# Patient Record
Sex: Male | Born: 1960 | Race: White | Hispanic: No | Marital: Married | Smoking: Current every day smoker
Health system: Southern US, Community
[De-identification: ages and names within clinical notes are randomized; demographics above are authoritative.]

## PROBLEM LIST (undated history)

## (undated) DIAGNOSIS — F419 Anxiety disorder, unspecified: Secondary | ICD-10-CM

## (undated) DIAGNOSIS — E079 Disorder of thyroid, unspecified: Secondary | ICD-10-CM

## (undated) HISTORY — DX: Anxiety disorder, unspecified: F41.9

## (undated) HISTORY — DX: Disorder of thyroid, unspecified: E07.9

---

## 2006-06-12 ENCOUNTER — Emergency Department: Payer: Self-pay | Admitting: Emergency Medicine

## 2006-06-12 ENCOUNTER — Other Ambulatory Visit: Payer: Self-pay

## 2007-05-25 ENCOUNTER — Emergency Department: Payer: Self-pay | Admitting: Emergency Medicine

## 2007-10-18 ENCOUNTER — Emergency Department: Payer: Self-pay | Admitting: Internal Medicine

## 2012-08-09 ENCOUNTER — Emergency Department: Payer: Self-pay | Admitting: Emergency Medicine

## 2012-08-09 LAB — CK TOTAL AND CKMB (NOT AT ARMC)
CK, Total: 61 U/L (ref 35–232)
CK-MB: <0.5 ng/mL — ABNORMAL LOW (ref 0.5–3.6)

## 2012-08-09 LAB — CBC WITH DIFFERENTIAL/PLATELET
Basophil #: 0.1 10*3/uL (ref 0.0–0.1)
Eosinophil %: 1.8 %
Lymphocyte #: 1.6 10*3/uL (ref 1.0–3.6)
Lymphocyte %: 15.5 %
MCHC: 33.9 g/dL (ref 32.0–36.0)
MCV: 95 fL (ref 80–100)
Monocyte %: 6.8 %
Neutrophil %: 75.3 %
Platelet: 281 10*3/uL (ref 150–440)
RDW: 13.5 % (ref 11.5–14.5)

## 2012-08-09 LAB — COMPREHENSIVE METABOLIC PANEL
Albumin: 4 g/dL (ref 3.4–5.0)
BUN: 10 mg/dL (ref 7–18)
Bilirubin,Total: 0.5 mg/dL (ref 0.2–1.0)
Calcium, Total: 8.4 mg/dL — ABNORMAL LOW (ref 8.5–10.1)
Co2: 25 mmol/L (ref 21–32)
Creatinine: 0.87 mg/dL (ref 0.60–1.30)
Glucose: 99 mg/dL (ref 65–99)
Osmolality: 280 (ref 275–301)
Potassium: 4 mmol/L (ref 3.5–5.1)
SGOT(AST): 20 U/L (ref 15–37)
Sodium: 141 mmol/L (ref 136–145)

## 2012-09-26 ENCOUNTER — Ambulatory Visit: Payer: Self-pay | Admitting: Family Medicine

## 2014-02-02 LAB — HM HEPATITIS C SCREENING LAB: HM Hepatitis Screen: NEGATIVE

## 2014-12-21 LAB — HM HIV SCREENING LAB: HM HIV Screening: NEGATIVE

## 2018-02-17 LAB — HM COLONOSCOPY

## 2018-03-10 ENCOUNTER — Other Ambulatory Visit
Admission: RE | Admit: 2018-03-10 | Discharge: 2018-03-10 | Disposition: A | Discharge status: Home / Self Care | Payer: Self-pay | Source: Ambulatory Visit | Attending: Gastroenterology | Admitting: Gastroenterology

## 2018-03-10 DIAGNOSIS — R1032 Left lower quadrant pain: Secondary | ICD-10-CM | POA: Insufficient documentation

## 2018-03-10 DIAGNOSIS — R1031 Right lower quadrant pain: Secondary | ICD-10-CM | POA: Insufficient documentation

## 2018-03-10 DIAGNOSIS — K529 Noninfective gastroenteritis and colitis, unspecified: Secondary | ICD-10-CM | POA: Insufficient documentation

## 2018-03-10 LAB — GASTROINTESTINAL PANEL BY PCR, STOOL (REPLACES STOOL CULTURE)

## 2018-03-10 LAB — C DIFFICILE QUICK SCREEN W PCR REFLEX
C Diff antigen: NEGATIVE
C Diff interpretation: NOT DETECTED
C Diff toxin: NEGATIVE

## 2019-07-15 ENCOUNTER — Encounter: Payer: Self-pay | Admitting: Family Medicine

## 2019-07-15 ENCOUNTER — Ambulatory Visit (INDEPENDENT_AMBULATORY_CARE_PROVIDER_SITE_OTHER): Payer: 59 | Admitting: Family Medicine

## 2019-07-15 ENCOUNTER — Other Ambulatory Visit: Payer: Self-pay

## 2019-07-15 VITALS — BP 148/85 | HR 69 | Temp 98.4°F | Resp 16 | Ht 70.0 in | Wt 194.0 lb

## 2019-07-15 DIAGNOSIS — F410 Panic disorder [episodic paroxysmal anxiety] without agoraphobia: Secondary | ICD-10-CM

## 2019-07-15 DIAGNOSIS — Z23 Encounter for immunization: Secondary | ICD-10-CM | POA: Diagnosis not present

## 2019-07-15 DIAGNOSIS — F411 Generalized anxiety disorder: Secondary | ICD-10-CM | POA: Insufficient documentation

## 2019-07-15 DIAGNOSIS — Z7689 Persons encountering health services in other specified circumstances: Secondary | ICD-10-CM

## 2019-07-15 DIAGNOSIS — G8929 Other chronic pain: Secondary | ICD-10-CM

## 2019-07-15 DIAGNOSIS — M25512 Pain in left shoulder: Secondary | ICD-10-CM

## 2019-07-15 DIAGNOSIS — E039 Hypothyroidism, unspecified: Secondary | ICD-10-CM

## 2019-07-15 DIAGNOSIS — M25511 Pain in right shoulder: Secondary | ICD-10-CM

## 2019-07-15 MED ORDER — LEVOTHYROXINE SODIUM 200 MCG PO TABS: 200.0000 ug | ORAL_TABLET | Freq: Every day | ORAL | 1 refills | Status: DC

## 2019-07-15 MED ORDER — CYCLOBENZAPRINE HCL 10 MG PO TABS: 10.0000 mg | ORAL_TABLET | Freq: Every day | ORAL | 1 refills | Status: DC

## 2019-07-15 MED ORDER — CLONAZEPAM 0.5 MG PO TABS: 0.7500 mg | ORAL_TABLET | Freq: Two times a day (BID) | ORAL | 2 refills | Status: DC

## 2019-07-15 NOTE — Patient Instructions (Addendum)
Thank you for coming to the office today.  rx refilled  DUE for FASTING BLOOD WORK (no food or drink after midnight before the lab appointment, only water or coffee without cream/sugar on the morning of)  SCHEDULE "Lab Only" visit in the morning at the clinic for lab draw in 4 WEEKS   - Make sure Lab Only appointment is at about 1 week before your next appointment, so that results will be available  For Lab Results, once available within 2-3 days of blood draw, you can can log in to MyChart online to view your results and a brief explanation. Also, we can discuss results at next follow-up visit.   Please schedule a Follow-up Appointment to: Return in about 4 weeks (around 08/12/2019) for Annual Physical.  If you have any other questions or concerns, please feel free to call the office or send a message through [place]. You may also schedule an earlier appointment if necessary.  Additionally, you may be receiving a survey about your experience at our office within a few days to 1 week by e-mail or mail. We value your feedback.  Nobie Putnam, DO [place]

## 2019-07-15 NOTE — Progress Notes (Signed)
Subjective:    Patient ID: Vincent Dunn, male    DOB: 07/14/1961, 58 y.o.   MRN: [id number]  Vincent Dunn is a 58 y.o. male presenting on 07/15/2019 for Establish Care (hypothyroidsm) and Anxiety (panic)  Previous PCP [facility]  HPI   Left Shoulder, Chronic / rotator cuff impingement syndrome Reports chronic problem, episodic flare, reptitive work with shoulders, left sided pain usually. Intereferes with sleep  Chronic Generalized Anxiety with panic attacks - Reports prior history major life stressors with work and other factors >30 years ago, was on Klonopin with great results, he has never experienced withdrawal symptoms, maintained control of anxiety with same dose Klonopin 0.75 BID (0.5mg  x 1 and half pill) over that time, then he states that change of doctors and they changed medicine and he has not done as well, he had panic attacks in the past and these have returned can wake up with insomnia and poor sleep with panic attack at times, worsening. He describes some generalized anxiety but that doesn't usually impair his function, he can get more spontaneous panic attack symptoms with difficulty breathing. - He was given rx Escitalopram 10mg  daily from previous most recent PCP, took for 1 week felt side effects and DC'd. In Past he took Sertraline with side effects.   Hypothyroidism Previous history other provider had him on dose Levothyroxine 200-266mcg dosing, he has been at 200 for several years now 3-4. He is due for lab.  Diverticulitis, history - Identified on last colonoscopy 2019. [facility]. Treated with antibiotics.  History of Gout - In past had gout flare, requested uric acid.   Health Maintenance:  Due for Flu Shot, will receive today   Updated HM Hep C 02/02/14 - negative HIV - 12/21/14 - negative  [facility] Dr 12/23/14 - 02/17/18 - Colonoscopy - diverticulitis >> flagyl/cipro antibiotic course. Will request record, it is not available in  careeverywhere   Depression screen [facility] 2/9 07/15/2019  Decreased Interest 0  Down, Depressed, Hopeless 0  PHQ - 2 Score 0  Difficult doing work/chores Not difficult at all     GAD 7 : Generalized Anxiety Score 07/15/2019  Nervous, Anxious, on Edge 1  Control/stop worrying 0  Worry too much - different things 1  Trouble relaxing 0  Restless 0  Easily annoyed or irritable 0  Afraid - awful might happen 0  Total GAD 7 Score 2  Anxiety Difficulty Not difficult at all      Past Medical History:  Diagnosis Date  . Anxiety   . Thyroid disease    History reviewed. No pertinent surgical history. Social History   Socioeconomic History  . Marital status: Married    Spouse name: Not on file  . Number of children: Not on file  . Years of education: 09/14/2019  . Highest education level: High school graduate  Occupational History  . Not on file  Social Needs  . Financial resource strain: Not on file  . Food insecurity    Worry: Not on file    Inability: Not on file  . Transportation needs    Medical: Not on file    Non-medical: Not on file  Tobacco Use  . Smoking status: Current Every Day Smoker    Packs/day: 0.50    Years: 40.00    Pack years: 20.00  . Smokeless tobacco: Never Used  Substance and Sexual Activity  . Alcohol use: Yes    Alcohol/week: 10.0 standard drinks    Types:  10 Standard drinks or equivalent per week  . Drug use: Never  . Sexual activity: Not on file  Lifestyle  . Physical activity    Days per week: Not on file    Minutes per session: Not on file  . Stress: Not on file  Relationships  . Social Musicianconnections    Talks on phone: Not on file    Gets together: Not on file    Attends religious service: Not on file    Active member of club or organization: Not on file    Attends meetings of clubs or organizations: Not on file    Relationship status: Not on file  . Intimate partner violence    Fear of current or ex partner: Not on file     Emotionally abused: Not on file    Physically abused: Not on file    Forced sexual activity: Not on file  Other Topics Concern  . Not on file  Social History Narrative  . Not on file   Family History  Problem Relation Age of Onset  . Cancer Mother        lung, brain  . Heart disease Father   . Dementia Sister    No current outpatient medications on file prior to visit.   No current facility-administered medications on file prior to visit.     Review of Systems Per HPI unless specifically indicated above     Objective:    BP (!) 148/85   Pulse 69   Temp 98.4 F (36.9 C) (Oral)   Resp 16   Ht 5\' 10"  (1.778 m)   Wt 194 lb (88 kg)   BMI 27.84 kg/m   Wt Readings from Last 3 Encounters:  07/15/19 194 lb (88 kg)    Physical Exam Vitals signs and nursing note reviewed.  Constitutional:      General: He is not in acute distress.    Appearance: He is well-developed. He is not diaphoretic.     Comments: Well-appearing, comfortable, cooperative  HENT:     Head: Normocephalic and atraumatic.  Eyes:     General:        Right eye: No discharge.        Left eye: No discharge.     Conjunctiva/sclera: Conjunctivae normal.  Cardiovascular:     Rate and Rhythm: Normal rate.  Pulmonary:     Effort: Pulmonary effort is normal.  Skin:    General: Skin is warm and dry.     Findings: No erythema or rash.  Neurological:     Mental Status: He is alert and oriented to person, place, and time.  Psychiatric:        Behavior: Behavior normal.     Comments: Well groomed, good eye contact, normal speech and thoughts    Results for orders placed or performed in visit on 07/15/19  HM HIV SCREENING LAB  Result Value Ref Range   HM HIV Screening Negative - Validated   HM HEPATITIS C SCREENING LAB  Result Value Ref Range   HM Hepatitis Screen Negative-Validated       Assessment & Plan:   Problem List Items Addressed This Visit    Acquired hypothyroidism    Long history, stable  hypothyroidism Continue current dose 200mcg daily levothyroxine Check lab within 1 month, adjust accordingly      Relevant Medications   levothyroxine (SYNTHROID) 200 MCG tablet   Chronic pain of both shoulders    Likely chronic rotator cuff impingement,  repetitive work Not focus of visit today, can return for further eval and consider meds, imaging x-ray, injection among other options      Relevant Medications   clonazePAM (KLONOPIN) 0.5 MG tablet   cyclobenzaprine (FLEXERIL) 10 MG tablet   Generalized anxiety disorder with panic attacks - Primary    Gradual worse and recurrence of chronic GAD w/ panic attacks Long history of anxiety, improved most on BDZ klonopin primarily due to panic attacks, has failed SSRI therapy in past - escitalopram, sertraline. - No complications or withdrawal or other red flags on BDZ - Currently off med for >1 year No reported co morbid depression - No prior dx / Psych / counseling  Plan: 1. Discussion on anxiety, management, complications 2. Check PMP AWARE [place] CSRS - re ordered Clonazepam 0.5mg  take 1.5 tabs 0.75 mg BID - monthly rx with refill ordered - likely office visit every 6 month for renewal 3. Future consider therapist/counseling  Mental health eval      Relevant Medications   clonazePAM (KLONOPIN) 0.5 MG tablet    Other Visit Diagnoses    Encounter to establish care with new doctor       Needs flu shot       Relevant Orders   Flu Vaccine QUAD 36+ mos IM (Completed)      Request outside records, including last colonoscopy.  Meds ordered this encounter  Medications  . clonazePAM (KLONOPIN) 0.5 MG tablet    Sig: Take 1.5 tablets (0.75 mg total) by mouth 2 (two) times daily.    Dispense:  90 tablet    Refill:  2  . levothyroxine (SYNTHROID) 200 MCG tablet    Sig: Take 1 tablet (200 mcg total) by mouth daily before breakfast.    Dispense:  90 tablet    Refill:  1  . cyclobenzaprine (FLEXERIL) 10 MG tablet    Sig: Take 1 tablet  (10 mg total) by mouth at bedtime.    Dispense:  90 tablet    Refill:  1    Follow up plan: Return in about 4 weeks (around 08/12/2019) for Annual Physical.   Future labs for physical 07/2019 Uric Acid, CMET, Lipid, A1c, CBC, TSH + Free T4, PSA  Nobie Putnam, DO [facility] 07/15/2019, 3:51 PM

## 2019-07-16 ENCOUNTER — Other Ambulatory Visit: Payer: Self-pay | Admitting: Family Medicine

## 2019-07-16 ENCOUNTER — Encounter: Payer: Self-pay | Admitting: Family Medicine

## 2019-07-16 DIAGNOSIS — E790 Hyperuricemia without signs of inflammatory arthritis and tophaceous disease: Secondary | ICD-10-CM

## 2019-07-16 DIAGNOSIS — E039 Hypothyroidism, unspecified: Secondary | ICD-10-CM

## 2019-07-16 DIAGNOSIS — F410 Panic disorder [episodic paroxysmal anxiety] without agoraphobia: Secondary | ICD-10-CM

## 2019-07-16 DIAGNOSIS — R7309 Other abnormal glucose: Secondary | ICD-10-CM

## 2019-07-16 DIAGNOSIS — Z8739 Personal history of other diseases of the musculoskeletal system and connective tissue: Secondary | ICD-10-CM

## 2019-07-16 DIAGNOSIS — Z0000 Encounter for general adult medical examination without abnormal findings: Secondary | ICD-10-CM

## 2019-07-16 DIAGNOSIS — Z125 Encounter for screening for malignant neoplasm of prostate: Secondary | ICD-10-CM

## 2019-07-16 DIAGNOSIS — E7800 Pure hypercholesterolemia, unspecified: Secondary | ICD-10-CM

## 2019-07-16 NOTE — Assessment & Plan Note (Signed)
Likely chronic rotator cuff impingement, repetitive work Not focus of visit today, can return for further eval and consider meds, imaging x-ray, injection among other options

## 2019-07-16 NOTE — Addendum Note (Signed)
Addended by: Olin Hauser on: 07/16/2019 03:46 PM   Modules accepted: Orders

## 2019-07-16 NOTE — Assessment & Plan Note (Signed)
Long history, stable hypothyroidism Continue current dose 243mcg daily levothyroxine Check lab within 1 month, adjust accordingly

## 2019-07-16 NOTE — Assessment & Plan Note (Signed)
Gradual worse and recurrence of chronic GAD w/ panic attacks Long history of anxiety, improved most on BDZ klonopin primarily due to panic attacks, has failed SSRI therapy in past - escitalopram, sertraline. - No complications or withdrawal or other red flags on BDZ - Currently off med for >1 year No reported co morbid depression - No prior dx / Psych / counseling  Plan: 1. Discussion on anxiety, management, complications 2. Check PMP AWARE [place] CSRS - re ordered Clonazepam 0.5mg  take 1.5 tabs 0.75 mg BID - monthly rx with refill ordered - likely office visit every 6 month for renewal 3. Future consider therapist/counseling  Mental health eval

## 2019-07-24 ENCOUNTER — Encounter: Payer: Self-pay | Admitting: Family Medicine

## 2019-08-06 ENCOUNTER — Other Ambulatory Visit: Payer: Self-pay

## 2019-08-06 DIAGNOSIS — F410 Panic disorder [episodic paroxysmal anxiety] without agoraphobia: Secondary | ICD-10-CM

## 2019-08-06 DIAGNOSIS — E039 Hypothyroidism, unspecified: Secondary | ICD-10-CM

## 2019-08-06 DIAGNOSIS — R7309 Other abnormal glucose: Secondary | ICD-10-CM

## 2019-08-06 DIAGNOSIS — Z8739 Personal history of other diseases of the musculoskeletal system and connective tissue: Secondary | ICD-10-CM

## 2019-08-06 DIAGNOSIS — F411 Generalized anxiety disorder: Secondary | ICD-10-CM

## 2019-08-06 DIAGNOSIS — Z0000 Encounter for general adult medical examination without abnormal findings: Secondary | ICD-10-CM

## 2019-08-06 DIAGNOSIS — E7800 Pure hypercholesterolemia, unspecified: Secondary | ICD-10-CM

## 2019-08-06 DIAGNOSIS — Z125 Encounter for screening for malignant neoplasm of prostate: Secondary | ICD-10-CM

## 2019-08-06 DIAGNOSIS — E790 Hyperuricemia without signs of inflammatory arthritis and tophaceous disease: Secondary | ICD-10-CM

## 2019-08-07 LAB — COMPLETE METABOLIC PANEL WITH GFR
AG Ratio: 1.9 (calc) (ref 1.0–2.5)
ALT: 22 U/L (ref 9–46)
AST: 20 U/L (ref 10–35)
Albumin: 4.4 g/dL (ref 3.6–5.1)
Alkaline phosphatase (APISO): 104 U/L (ref 35–144)
BUN: 10 mg/dL (ref 7–25)
CO2: 22 mmol/L (ref 20–32)
Calcium: 9.3 mg/dL (ref 8.6–10.3)
Chloride: 106 mmol/L (ref 98–110)
Creat: 0.87 mg/dL (ref 0.70–1.25)
GFR, Est African American: 109 mL/min/{1.73_m2} (ref >=60)
GFR, Est Non African American: 94 mL/min/{1.73_m2} (ref >=60)
Globulin: 2.3 g/dL (calc) (ref 1.9–3.7)
Glucose, Bld: 88 mg/dL (ref 65–99)
Potassium: 4.2 mmol/L (ref 3.5–5.3)
Sodium: 138 mmol/L (ref 135–146)
Total Bilirubin: 0.6 mg/dL (ref 0.2–1.2)
Total Protein: 6.7 g/dL (ref 6.1–8.1)

## 2019-08-07 LAB — CBC WITH DIFFERENTIAL/PLATELET
Absolute Monocytes: 549 cells/uL (ref 200–950)
Basophils Absolute: 79 cells/uL (ref 0–200)
Basophils Relative: 1.3 %
Eosinophils Absolute: 177 cells/uL (ref 15–500)
Eosinophils Relative: 2.9 %
HCT: 52.5 % — ABNORMAL HIGH (ref 38.5–50.0)
Hemoglobin: 17.8 g/dL — ABNORMAL HIGH (ref 13.2–17.1)
Lymphs Abs: 1562 cells/uL (ref 850–3900)
MCH: 33 pg (ref 27.0–33.0)
MCHC: 33.9 g/dL (ref 32.0–36.0)
MCV: 97.2 fL (ref 80.0–100.0)
MPV: 10.1 fL (ref 7.5–12.5)
Monocytes Relative: 9 %
Neutro Abs: 3733 cells/uL (ref 1500–7800)
Neutrophils Relative %: 61.2 %
Platelets: 305 10*3/uL (ref 140–400)
RBC: 5.4 10*6/uL (ref 4.20–5.80)
RDW: 13 % (ref 11.0–15.0)
Total Lymphocyte: 25.6 %
WBC: 6.1 10*3/uL (ref 3.8–10.8)

## 2019-08-07 LAB — HEMOGLOBIN A1C
Hgb A1c MFr Bld: 5.2 % of total Hgb (ref <5.7)
Mean Plasma Glucose: 103 (calc)
eAG (mmol/L): 5.7 (calc)

## 2019-08-07 LAB — T4, FREE: Free T4: 1.4 ng/dL (ref 0.8–1.8)

## 2019-08-07 LAB — LIPID PANEL
Cholesterol: 170 mg/dL (ref <200)
HDL: 38 mg/dL — ABNORMAL LOW (ref >=40)
LDL Cholesterol (Calc): 98 mg/dL (calc)
Non-HDL Cholesterol (Calc): 132 mg/dL (calc) — ABNORMAL HIGH (ref <130)
Total CHOL/HDL Ratio: 4.5 (calc) (ref <5.0)
Triglycerides: 227 mg/dL — ABNORMAL HIGH (ref <150)

## 2019-08-07 LAB — PSA: PSA: 0.6 ng/mL (ref <=4.0)

## 2019-08-07 LAB — TSH: TSH: 0.21 mIU/L — ABNORMAL LOW (ref 0.40–4.50)

## 2019-08-07 LAB — URIC ACID: Uric Acid, Serum: 8 mg/dL (ref 4.0–8.0)

## 2019-08-13 ENCOUNTER — Encounter: Payer: Self-pay | Admitting: Family Medicine

## 2019-08-13 ENCOUNTER — Ambulatory Visit (INDEPENDENT_AMBULATORY_CARE_PROVIDER_SITE_OTHER): Payer: 59 | Admitting: Family Medicine

## 2019-08-13 ENCOUNTER — Other Ambulatory Visit: Payer: Self-pay | Admitting: Family Medicine

## 2019-08-13 ENCOUNTER — Other Ambulatory Visit: Payer: Self-pay

## 2019-08-13 VITALS — BP 122/67 | HR 72 | Temp 98.1°F | Resp 22 | Ht 70.0 in | Wt 197.6 lb

## 2019-08-13 DIAGNOSIS — E039 Hypothyroidism, unspecified: Secondary | ICD-10-CM

## 2019-08-13 DIAGNOSIS — E782 Mixed hyperlipidemia: Secondary | ICD-10-CM | POA: Insufficient documentation

## 2019-08-13 DIAGNOSIS — F410 Panic disorder [episodic paroxysmal anxiety] without agoraphobia: Secondary | ICD-10-CM

## 2019-08-13 DIAGNOSIS — E781 Pure hyperglyceridemia: Secondary | ICD-10-CM | POA: Diagnosis not present

## 2019-08-13 DIAGNOSIS — D582 Other hemoglobinopathies: Secondary | ICD-10-CM

## 2019-08-13 DIAGNOSIS — F411 Generalized anxiety disorder: Secondary | ICD-10-CM

## 2019-08-13 DIAGNOSIS — Z0000 Encounter for general adult medical examination without abnormal findings: Secondary | ICD-10-CM | POA: Diagnosis not present

## 2019-08-13 DIAGNOSIS — Z8739 Personal history of other diseases of the musculoskeletal system and connective tissue: Secondary | ICD-10-CM

## 2019-08-13 NOTE — Assessment & Plan Note (Signed)
Elevated Hgb to 17.8 and HCT 52.5 Still active some day smoker, chronic history 40 years. Can be attributed to smoking Gradual increase in past few years.  Will follow-up lab results in 6 months - with CBC repeat, EPO, peripheral smear

## 2019-08-13 NOTE — Assessment & Plan Note (Signed)
Slightly low TSH, but normal Free T4 Clinically controlled, chronic hypothyroidism Continue current dose Levothyroxine 222mcg daily Re-check labs in 6 months

## 2019-08-13 NOTE — Assessment & Plan Note (Signed)
Improved on resumed Klonopin dosing. 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 - No complications or withdrawal or other red flags on BDZ No reported co morbid depression - No prior dx / Psych / counseling  Plan: 1. Continue current therapy Future consider therapist/counseling  Mental health eval

## 2019-08-13 NOTE — Assessment & Plan Note (Signed)
Controlled cholesterol on lifestyle except elevated TG Last lipid panel 07/2019  Plan: 1. Defer statin at this time 2. Encourage improved lifestyle - low carb/cholesterol, reduce portion size, continue improving regular exercise  F/u yearly lipids

## 2019-08-13 NOTE — Progress Notes (Signed)
Subjective:    Patient ID: Vincent Dunn, male    DOB: 18-Feb-1961, 58 y.o.   MRN: [id number]  Vincent Dunn is a 58 y.o. male presenting on 08/13/2019 for Annual Exam   HPI   Here for Annual Physical and Lab Review.  Wellness / Lifestyle Overweight BMI >29 Hyperlipidemia (Hypertriglycerides) Lab showed TG >220 Weight gain 2-3 lbs in month He is improving diet and goal to improve regular exercise Sleeping better now see below  Chronic Generalized Anxiety with panic attacks See last note for background. Continues on chronic Klonopin 0.75mg  BID with good results for his insomnia, had been on for >30 years No recent acute panic attacks. Failed SSRI in past lexapro, sertraline  Hypothyroidism Last lab mild low TSH, normal Free T4. Feels fine. Taking levothyroxine 265mcg  Additional problems  Elevated Hemoglobin - Prior trend up from 14-15 now up to 16-17 on Hgb over past few years Active smoker Denies symptoms  History of Gout Last few weeks ago L ankle felt sore and swelling Last result Uric Acid 8.0 Not on prophylaxis. Trying to limit food triggers  Tobacco Abuse Trying to quit smoking, reduce and chip away, < 0.5ppd Not quite ready to fully quit  Health Maintenance:  UTD Flu Vaccine  Updated HM Hep C 02/02/14 - negative HIV - 12/21/14 - negative  Prostate CA Screening: Last PSA 0.6 (07/2019). Currently asymptomatic. No known family history of prostate CA.   [facility] Dr Alice Reichert - 03/05/18 [facility] Dr Alice Reichert- Colonoscopy - diverticulitis, lymphocitic, no polyp, return 10 years.    Depression screen [facility] 2/9 08/13/2019 07/15/2019  Decreased Interest 0 0  Down, Depressed, Hopeless 0 0  PHQ - 2 Score 0 0  Altered sleeping 0 -  Tired, decreased energy 1 -  Change in appetite 0 -  Feeling bad or failure about yourself  0 -  Trouble concentrating 0 -  Moving slowly or fidgety/restless 0 -  Suicidal thoughts 0 -  PHQ-9 Score 1 -  Difficult doing work/chores Not  difficult at all Not difficult at all    Past Medical History:  Diagnosis Date  . Anxiety   . Thyroid disease    History reviewed. No pertinent surgical history. Social History   Socioeconomic History  . Marital status: Married    Spouse name: Not on file  . Number of children: Not on file  . Years of education: [employer]  . Highest education level: High school graduate  Occupational History  . Not on file  Social Needs  . Financial resource strain: Not on file  . Food insecurity    Worry: Not on file    Inability: Not on file  . Transportation needs    Medical: Not on file    Non-medical: Not on file  Tobacco Use  . Smoking status: Current Every Day Smoker    Packs/day: 0.50    Years: 40.00    Pack years: 20.00  . Smokeless tobacco: Never Used  Substance and Sexual Activity  . Alcohol use: Yes    Alcohol/week: 10.0 standard drinks    Types: 10 Standard drinks or equivalent per week  . Drug use: Never  . Sexual activity: Not on file  Lifestyle  . Physical activity    Days per week: Not on file    Minutes per session: Not on file  . Stress: Not on file  Relationships  . Social connections    Talks on phone: Not on file  Gets together: Not on file    Attends religious service: Not on file    Active member of club or organization: Not on file    Attends meetings of clubs or organizations: Not on file    Relationship status: Not on file  . Intimate partner violence    Fear of current or ex partner: Not on file    Emotionally abused: Not on file    Physically abused: Not on file    Forced sexual activity: Not on file  Other Topics Concern  . Not on file  Social History Narrative  . Not on file   Family History  Problem Relation Age of Onset  . Cancer Mother        lung, brain  . Heart disease Father   . Dementia Sister    Current Outpatient Medications on File Prior to Visit  Medication Sig  . clonazePAM (KLONOPIN) 0.5 MG tablet Take 1.5 tablets  (0.75 mg total) by mouth 2 (two) times daily.  . cyclobenzaprine (FLEXERIL) 10 MG tablet Take 1 tablet (10 mg total) by mouth at bedtime.  Marland Kitchen levothyroxine (SYNTHROID) 200 MCG tablet Take 1 tablet (200 mcg total) by mouth daily before breakfast.   No current facility-administered medications on file prior to visit.     Review of Systems  Constitutional: Negative for activity change, appetite change, chills, diaphoresis, fatigue and fever.  HENT: Negative for congestion and hearing loss.   Eyes: Negative for visual disturbance.  Respiratory: Negative for apnea, cough, chest tightness, shortness of breath and wheezing.   Cardiovascular: Negative for chest pain, palpitations and leg swelling.  Gastrointestinal: Negative for abdominal pain, anal bleeding, blood in stool, constipation, diarrhea, nausea and vomiting.  Endocrine: Negative for cold intolerance.  Genitourinary: Negative for difficulty urinating, dysuria, frequency and hematuria.  Musculoskeletal: Negative for arthralgias, back pain and neck pain.  Skin: Negative for rash.  Allergic/Immunologic: Negative for environmental allergies.  Neurological: Negative for dizziness, weakness, light-headedness, numbness and headaches.  Hematological: Negative for adenopathy.  Psychiatric/Behavioral: Negative for behavioral problems, dysphoric mood and sleep disturbance. The patient is not nervous/anxious.    Per HPI unless specifically indicated above      Objective:    BP 122/67 (BP Location: Left Arm, Cuff Size: Normal)   Pulse 72   Temp 98.1 F (36.7 C) (Oral)   Resp (!) 22   Ht 5\' 10"  (1.778 m)   Wt 197 lb 9.6 oz (89.6 kg)   SpO2 100%   BMI 28.35 kg/m   Wt Readings from Last 3 Encounters:  08/13/19 197 lb 9.6 oz (89.6 kg)  07/15/19 194 lb (88 kg)    Physical Exam Vitals signs and nursing note reviewed.  Constitutional:      General: He is not in acute distress.    Appearance: He is well-developed. He is not diaphoretic.      Comments: Well-appearing, comfortable, cooperative  HENT:     Head: Normocephalic and atraumatic.  Eyes:     General:        Right eye: No discharge.        Left eye: No discharge.     Conjunctiva/sclera: Conjunctivae normal.     Pupils: Pupils are equal, round, and reactive to light.  Neck:     Musculoskeletal: Normal range of motion and neck supple.     Thyroid: No thyromegaly.     Comments: No carotid bruits Cardiovascular:     Rate and Rhythm: Normal rate and regular rhythm.  Heart sounds: Normal heart sounds. No murmur.  Pulmonary:     Effort: Pulmonary effort is normal. No respiratory distress.     Breath sounds: Normal breath sounds. No wheezing or rales.  Abdominal:     General: Bowel sounds are normal. There is no distension.     Palpations: Abdomen is soft. There is no mass.     Tenderness: There is no abdominal tenderness.  Musculoskeletal: Normal range of motion.        General: No tenderness.     Comments: Upper / Lower Extremities: - Normal muscle tone, strength bilateral upper extremities 5/5, lower extremities 5/5  Lymphadenopathy:     Cervical: No cervical adenopathy.  Skin:    General: Skin is warm and dry.     Findings: No erythema or rash.  Neurological:     Mental Status: He is alert and oriented to person, place, and time.     Comments: Distal sensation intact to light touch all extremities  Psychiatric:        Behavior: Behavior normal.     Comments: Well groomed, good eye contact, normal speech and thoughts    Results for orders placed or performed in visit on 08/06/19  Uric acid  Result Value Ref Range   Uric Acid, Serum 8.0 4.0 - 8.0 mg/dL  T4, free  Result Value Ref Range   Free T4 1.4 0.8 - 1.8 ng/dL  TSH  Result Value Ref Range   TSH 0.21 (L) 0.40 - 4.50 mIU/L  PSA  Result Value Ref Range   PSA 0.6 < OR = 4.0 ng/mL  Lipid panel  Result Value Ref Range   Cholesterol 170 <200 mg/dL   HDL 38 (L) > OR = 40 mg/dL   Triglycerides  409 (H) <150 mg/dL   LDL Cholesterol (Calc) 98 mg/dL (calc)   Total CHOL/HDL Ratio 4.5 <5.0 (calc)   Non-HDL Cholesterol (Calc) 132 (H) <130 mg/dL (calc)  COMPLETE METABOLIC PANEL WITH GFR  Result Value Ref Range   Glucose, Bld 88 65 - 99 mg/dL   BUN 10 7 - 25 mg/dL   Creat 8.11 9.14 - 7.82 mg/dL   GFR, Est Non African American 94 > OR = 60 mL/min/1.62m2   GFR, Est African American 109 > OR = 60 mL/min/1.77m2   BUN/Creatinine Ratio NOT APPLICABLE 6 - 22 (calc)   Sodium 138 135 - 146 mmol/L   Potassium 4.2 3.5 - 5.3 mmol/L   Chloride 106 98 - 110 mmol/L   CO2 22 20 - 32 mmol/L   Calcium 9.3 8.6 - 10.3 mg/dL   Total Protein 6.7 6.1 - 8.1 g/dL   Albumin 4.4 3.6 - 5.1 g/dL   Globulin 2.3 1.9 - 3.7 g/dL (calc)   AG Ratio 1.9 1.0 - 2.5 (calc)   Total Bilirubin 0.6 0.2 - 1.2 mg/dL   Alkaline phosphatase (APISO) 104 35 - 144 U/L   AST 20 10 - 35 U/L   ALT 22 9 - 46 U/L  CBC with Differential/Platelet  Result Value Ref Range   WBC 6.1 3.8 - 10.8 Thousand/uL   RBC 5.40 4.20 - 5.80 Million/uL   Hemoglobin 17.8 (H) 13.2 - 17.1 g/dL   HCT 95.6 (H) 21.3 - 08.6 %   MCV 97.2 80.0 - 100.0 fL   MCH 33.0 27.0 - 33.0 pg   MCHC 33.9 32.0 - 36.0 g/dL   RDW 57.8 46.9 - 62.9 %   Platelets 305 140 - 400 Thousand/uL   MPV  10.1 7.5 - 12.5 fL   Neutro Abs 3,733 1,500 - 7,800 cells/uL   Lymphs Abs 1,562 850 - 3,900 cells/uL   Absolute Monocytes 549 200 - 950 cells/uL   Eosinophils Absolute 177 15 - 500 cells/uL   Basophils Absolute 79 0 - 200 cells/uL   Neutrophils Relative % 61.2 %   Total Lymphocyte 25.6 %   Monocytes Relative 9.0 %   Eosinophils Relative 2.9 %   Basophils Relative 1.3 %  Hemoglobin A1c  Result Value Ref Range   Hgb A1c MFr Bld 5.2 <5.7 % of total Hgb   Mean Plasma Glucose 103 (calc)   eAG (mmol/L) 5.7 (calc)      Assessment & Plan:   Problem List Items Addressed This Visit    Hypertriglyceridemia    Controlled cholesterol on lifestyle except elevated TG Last lipid  panel 07/2019  Plan: 1. Defer statin at this time 2. Encourage improved lifestyle - low carb/cholesterol, reduce portion size, continue improving regular exercise  F/u yearly lipids      Generalized anxiety disorder with panic attacks    Improved on resumed Klonopin dosing. 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 - No complications or withdrawal or other red flags on BDZ No reported co morbid depression - No prior dx / Psych / counseling  Plan: 1. Continue current therapy Future consider therapist/counseling  Mental health eval      Elevated hemoglobin (HCC)    Elevated Hgb to 17.8 and HCT 52.5 Still active some day smoker, chronic history 40 years. Can be attributed to smoking Gradual increase in past few years.  Will follow-up lab results in 6 months - with CBC repeat, EPO, peripheral smear      Acquired hypothyroidism    Slightly low TSH, but normal Free T4 Clinically controlled, chronic hypothyroidism Continue current dose Levothyroxine 200mcg daily Re-check labs in 6 months       Other Visit Diagnoses    Annual physical exam    -  Primary      Updated Health Maintenance information Reviewed recent lab results with patient Encouraged improvement to lifestyle with diet and exercise - Goal of weight loss   No orders of the defined types were placed in this encounter.    Follow up plan: Return in about 6 months (around 02/10/2020) for 6 month follow-up Lab results Gout / Hgb / Smoking.   Future labs 6 months Hgb elevated, uric, TSH Free T4  Saralyn PilarAlexander Minard Millirons, DO [facility] 08/13/2019, 8:28 AM

## 2019-08-13 NOTE — Patient Instructions (Addendum)
Thank you for coming to the office today.  Let me know when ready for refills by phone - for Klonopin in 3 months , no apt needed for that  Next time we can review blood work again for elevated hemoglobin and gout.  Also smoking if interested in medicine - Chantix or Buproprion (Zyban)  DUE for FASTING BLOOD WORK (no food or drink after midnight before the lab appointment, only water or coffee without cream/sugar on the morning of)  SCHEDULE "Lab Only" visit in the morning at the clinic for lab draw in 6 MONTHS  - Make sure Lab Only appointment is at about 1 week before your next appointment, so that results will be available  For Lab Results, once available within 2-3 days of blood draw, you can can log in to MyChart online to view your results and a brief explanation. Also, we can discuss results at next follow-up visit.   Please schedule a Follow-up Appointment to: Return in about 6 months (around 02/10/2020) for 6 month follow-up Lab results Gout / Hgb / Smoking.  If you have any other questions or concerns, please feel free to call the office or send a message through [facility]. You may also schedule an earlier appointment if necessary.  Additionally, you may be receiving a survey about your experience at our office within a few days to 1 week by e-mail or mail. We value your feedback.  Nobie Putnam, DO [place]

## 2019-08-31 ENCOUNTER — Telehealth: Payer: Self-pay | Admitting: Family Medicine

## 2019-08-31 DIAGNOSIS — M109 Gout, unspecified: Secondary | ICD-10-CM

## 2019-08-31 MED ORDER — PREDNISONE 20 MG PO TABS: ORAL_TABLET | ORAL | 0 refills | Status: DC

## 2019-08-31 NOTE — Telephone Encounter (Signed)
Pt complains of swelling, pain in his left ankle x 4 days. He state that he's been icing and trying to keep it elevated all weekend. He's also been taking Ibuprofen every 4-6 hrs. He state it's so swollen that its causing him difficulty with putting his shoe on. He admits that he had a gout flare up a few weeks ago before his last appointment, but it approved. He state that he discussed it with you at his last appointment and you checked his uric acid levels. He's requesting that you call him in something to his pharmacy. Please advise

## 2019-08-31 NOTE — Telephone Encounter (Signed)
Patient was just seen recently.  Could you call him to triage his symptoms more? And then I can make some recommendations.  Typically I would offer him a Virtual Visit for this issue, if you see any opening on schedule we can do that, otherwise, may have to offer treatment now and then follow-up with him virtually in a week.  Or he can go to Urgent care if severe pain.  Vincent Dunn, [facility] 08/31/2019, 10:51 AM

## 2019-08-31 NOTE — Telephone Encounter (Signed)
Pt.said he have gotten gout requesting mediation to be called in

## 2019-08-31 NOTE — Telephone Encounter (Signed)
Called patient.  Start Prednisone for gout flare. 8 day taper as advised. He has tolerated this before.  Prior uric acid 8  He is not on other rx for gout.  It feels similar to prior gout flare.  Advised him to follow-up if not improving.  Nobie Putnam, DO [facility] 08/31/2019, 12:37 PM

## 2019-10-12 ENCOUNTER — Telehealth: Payer: Self-pay | Admitting: Family Medicine

## 2019-10-12 DIAGNOSIS — G8929 Other chronic pain: Secondary | ICD-10-CM

## 2019-10-12 DIAGNOSIS — F410 Panic disorder [episodic paroxysmal anxiety] without agoraphobia: Secondary | ICD-10-CM

## 2019-10-12 MED ORDER — CLONAZEPAM 0.5 MG PO TABS: 0.7500 mg | ORAL_TABLET | Freq: Two times a day (BID) | ORAL | 2 refills | Status: DC

## 2019-10-12 MED ORDER — CYCLOBENZAPRINE HCL 10 MG PO TABS: 10.0000 mg | ORAL_TABLET | Freq: Every day | ORAL | 1 refills | Status: DC

## 2019-10-12 NOTE — Telephone Encounter (Signed)
Pt  Called requesting refill on clonazepam, flexeril

## 2019-11-04 ENCOUNTER — Other Ambulatory Visit: Payer: Self-pay

## 2019-11-04 ENCOUNTER — Encounter: Payer: Self-pay | Admitting: Family Medicine

## 2019-11-04 ENCOUNTER — Ambulatory Visit (INDEPENDENT_AMBULATORY_CARE_PROVIDER_SITE_OTHER): Payer: 59 | Admitting: Family Medicine

## 2019-11-04 DIAGNOSIS — M109 Gout, unspecified: Secondary | ICD-10-CM

## 2019-11-04 MED ORDER — PREDNISONE 20 MG PO TABS: ORAL_TABLET | ORAL | 0 refills | Status: DC

## 2019-11-04 NOTE — Progress Notes (Signed)
Virtual Visit via Telephone The purpose of this virtual visit is to provide medical care while limiting exposure to the novel coronavirus (COVID19) for both patient and office staff.  Consent was obtained for phone visit:  Yes.   Answered questions that patient had about telehealth interaction:  Yes.   I discussed the limitations, risks, security and privacy concerns of performing an evaluation and management service by telephone. I also discussed with the patient that there may be a patient responsible charge related to this service. The patient expressed understanding and agreed to proceed.  Patient Location: Home Provider Location: [facility])  ---------------------------------------------------------------------- Chief Complaint  Patient presents with  . Gout    left ankle onset yesterday    S: Reviewed CMA documentation. I have called patient and gathered additional HPI as follows:  Acute Gout Flare / Left Ankle Pain Reports that symptoms started yesterday 11/03/19, acute onset pain onset in Left ankle - it has limited his ability to walk on it and weight bear. Describes now swelling settled in ankle, both sides medial /lateral ankle with some redness and warmth as well. He has had prior known gout flares similar to this, as discussed in 08/2019. Last flare up in 08/31/19 same location, we sent in Prednisone taper that resolved his acute gout flare at that time. - He says now trigger could be diet, he has tried to limit but not always adhering. He does not take preventative med. He had prior Uric Acid lvl of 8.0 in 07/2019. He has tried to stay very well hydrated yesterday but did not help. - Taking Ibuprofen 200mg  x 4 = 800mg  PRN took one this AM.  Denies any high risk travel to areas of current concern for COVID19. Denies any known or suspected exposure to person with or possibly with COVID19.  Denies any fevers, chills, sweats, body ache, cough, shortness of  breath, sinus pain or pressure, headache, abdominal pain, diarrhea  Past Medical History:  Diagnosis Date  . Anxiety   . Thyroid disease    Social History   Tobacco Use  . Smoking status: Current Every Day Smoker    Packs/day: 0.50    Years: 40.00    Pack years: 20.00  . Smokeless tobacco: Current User  Substance Use Topics  . Alcohol use: Yes    Alcohol/week: 10.0 standard drinks    Types: 10 Standard drinks or equivalent per week  . Drug use: Never    Current Outpatient Medications:  .  clonazePAM (KLONOPIN) 0.5 MG tablet, Take 1.5 tablets (0.75 mg total) by mouth 2 (two) times daily., Disp: 90 tablet, Rfl: 2 .  cyclobenzaprine (FLEXERIL) 10 MG tablet, Take 1 tablet (10 mg total) by mouth at bedtime., Disp: 90 tablet, Rfl: 1 .  levothyroxine (SYNTHROID) 200 MCG tablet, Take 1 tablet (200 mcg total) by mouth daily before breakfast., Disp: 90 tablet, Rfl: 1 .  predniSONE (DELTASONE) 20 MG tablet, Take with food one dose daily - start 2 tablets (40mg ) for 3 days, then 1 tab (20mg ) for 3 days, then half tab (10mg ) for 2 days, Disp: 10 tablet, Rfl: 0  Depression screen [facility] 2/9 11/04/2019 08/13/2019 07/15/2019  Decreased Interest 0 0 0  Down, Depressed, Hopeless 0 0 0  PHQ - 2 Score 0 0 0  Altered sleeping - 0 -  Tired, decreased energy - 1 -  Change in appetite - 0 -  Feeling bad or failure about yourself  - 0 -  Trouble concentrating -  0 -  Moving slowly or fidgety/restless - 0 -  Suicidal thoughts - 0 -  PHQ-9 Score - 1 -  Difficult doing work/chores - Not difficult at all Not difficult at all    GAD 7 : Generalized Anxiety Score 08/13/2019 07/15/2019  Nervous, Anxious, on Edge 0 1  Control/stop worrying 0 0  Worry too much - different things 0 1  Trouble relaxing 0 0  Restless 0 0  Easily annoyed or irritable 0 0  Afraid - awful might happen 0 0  Total GAD 7 Score 0 2  Anxiety Difficulty - Not difficult at all     -------------------------------------------------------------------------- O: No physical exam performed due to remote telephone encounter.  Lab results reviewed.  No results found for this or any previous visit (from the past 2160 hour(s)).  -------------------------------------------------------------------------- A&P:  Problem List Items Addressed This Visit    None    Visit Diagnoses    Acute gout of left ankle, unspecified cause       Relevant Medications   predniSONE (DELTASONE) 20 MG tablet     Clinically consistent with acute gout flare of Left ankle, onset within 24 hours Tried NSAID limited relief, similar to prior gout flare in same location 08/2019 resolved w/ prednisone Known chronic recurrent gout Not on uric acid lowering therapy - Last uric acid level 8.0 (07/2019)  Plan: 1. Start Prednisone taper same dose as before - that has worked. - HOLD NSAID oral Ibuprofen while on prednisone, may resume after if need 3. Avoid excessive ambulation, relative rest, ice if helps, can take Tylenol PRN 4. Declined note for work 5. Avoid food triggers (red meat, alcohol) - goal to limit triggers to avoid adding prophylaxis medication in future, discussed can reconsider if interested 6. Follow-up as need if not improve   Meds ordered this encounter  Medications  . predniSONE (DELTASONE) 20 MG tablet    Sig: Take with food one dose daily - start 2 tablets (40mg ) for 3 days, then 1 tab (20mg ) for 3 days, then half tab (10mg ) for 2 days    Dispense:  10 tablet    Refill:  0    Follow-up: - Return in 1-2 week if not improved or if recurrent gout flare  Patient verbalizes understanding with the above medical recommendations including the limitation of remote medical advice.  Specific follow-up and call-back criteria were given for patient to follow-up or seek medical care more urgently if needed.   - Time spent in direct consultation with patient on phone: 8  minutes  , DO [facility] 11/04/2019, 2:06 PM

## 2020-01-05 ENCOUNTER — Other Ambulatory Visit: Payer: Self-pay | Admitting: Family Medicine

## 2020-01-05 DIAGNOSIS — F410 Panic disorder [episodic paroxysmal anxiety] without agoraphobia: Secondary | ICD-10-CM

## 2020-01-05 DIAGNOSIS — E039 Hypothyroidism, unspecified: Secondary | ICD-10-CM

## 2020-01-05 MED ORDER — CLONAZEPAM 0.5 MG PO TABS: 0.7500 mg | ORAL_TABLET | Freq: Two times a day (BID) | ORAL | 2 refills | Status: DC

## 2020-01-05 MED ORDER — LEVOTHYROXINE SODIUM 200 MCG PO TABS: 200.0000 ug | ORAL_TABLET | Freq: Every day | ORAL | 1 refills | Status: DC

## 2020-01-05 NOTE — Telephone Encounter (Signed)
Pt is requesting refills on clonazepam,levothyroxine

## 2020-01-06 ENCOUNTER — Other Ambulatory Visit: Payer: Self-pay | Admitting: Family Medicine

## 2020-01-06 DIAGNOSIS — F410 Panic disorder [episodic paroxysmal anxiety] without agoraphobia: Secondary | ICD-10-CM

## 2020-02-03 ENCOUNTER — Other Ambulatory Visit: Payer: 59

## 2020-02-03 ENCOUNTER — Other Ambulatory Visit: Payer: Self-pay

## 2020-02-03 DIAGNOSIS — D582 Other hemoglobinopathies: Secondary | ICD-10-CM

## 2020-02-03 DIAGNOSIS — E039 Hypothyroidism, unspecified: Secondary | ICD-10-CM

## 2020-02-03 DIAGNOSIS — Z8739 Personal history of other diseases of the musculoskeletal system and connective tissue: Secondary | ICD-10-CM

## 2020-02-05 LAB — PATHOLOGIST SMEAR REVIEW

## 2020-02-05 LAB — CBC WITH DIFFERENTIAL/PLATELET
Absolute Monocytes: 616 {cells}/uL (ref 200–950)
Basophils Absolute: 49 {cells}/uL (ref 0–200)
Basophils Relative: 0.7 %
Eosinophils Absolute: 217 {cells}/uL (ref 15–500)
Eosinophils Relative: 3.1 %
HCT: 50.2 % — ABNORMAL HIGH (ref 38.5–50.0)
Hemoglobin: 17 g/dL (ref 13.2–17.1)
Lymphs Abs: 1400 {cells}/uL (ref 850–3900)
MCH: 32.6 pg (ref 27.0–33.0)
MCHC: 33.9 g/dL (ref 32.0–36.0)
MCV: 96.4 fL (ref 80.0–100.0)
MPV: 10.5 fL (ref 7.5–12.5)
Monocytes Relative: 8.8 %
Neutro Abs: 4718 {cells}/uL (ref 1500–7800)
Neutrophils Relative %: 67.4 %
Platelets: 238 Thousand/uL (ref 140–400)
RBC: 5.21 Million/uL (ref 4.20–5.80)
RDW: 12.8 % (ref 11.0–15.0)
Total Lymphocyte: 20 %
WBC: 7 Thousand/uL (ref 3.8–10.8)

## 2020-02-05 LAB — TSH: TSH: 1.1 mIU/L (ref 0.40–4.50)

## 2020-02-05 LAB — ERYTHROPOIETIN: Erythropoietin: 5.1 m[IU]/mL (ref 2.6–18.5)

## 2020-02-05 LAB — T4, FREE: Free T4: 1.4 ng/dL (ref 0.8–1.8)

## 2020-02-05 LAB — URIC ACID: Uric Acid, Serum: 9.8 mg/dL — ABNORMAL HIGH (ref 4.0–8.0)

## 2020-02-10 ENCOUNTER — Other Ambulatory Visit: Payer: Self-pay

## 2020-02-10 ENCOUNTER — Ambulatory Visit (INDEPENDENT_AMBULATORY_CARE_PROVIDER_SITE_OTHER): Payer: 59 | Admitting: Family Medicine

## 2020-02-10 ENCOUNTER — Encounter: Payer: Self-pay | Admitting: Family Medicine

## 2020-02-10 VITALS — BP 130/81 | HR 72 | Temp 96.9°F | Resp 16 | Ht 70.0 in | Wt 198.0 lb

## 2020-02-10 DIAGNOSIS — D582 Other hemoglobinopathies: Secondary | ICD-10-CM

## 2020-02-10 DIAGNOSIS — M1A9XX0 Chronic gout, unspecified, without tophus (tophi): Secondary | ICD-10-CM | POA: Insufficient documentation

## 2020-02-10 DIAGNOSIS — M1A0710 Idiopathic chronic gout, right ankle and foot, without tophus (tophi): Secondary | ICD-10-CM | POA: Diagnosis not present

## 2020-02-10 DIAGNOSIS — E039 Hypothyroidism, unspecified: Secondary | ICD-10-CM | POA: Diagnosis not present

## 2020-02-10 DIAGNOSIS — F410 Panic disorder [episodic paroxysmal anxiety] without agoraphobia: Secondary | ICD-10-CM

## 2020-02-10 DIAGNOSIS — F411 Generalized anxiety disorder: Secondary | ICD-10-CM

## 2020-02-10 MED ORDER — COLCHICINE 0.6 MG PO TABS: 0.6000 mg | ORAL_TABLET | Freq: Every day | ORAL | 2 refills | Status: DC

## 2020-02-10 MED ORDER — ALLOPURINOL 100 MG PO TABS: 100.0000 mg | ORAL_TABLET | Freq: Every day | ORAL | 2 refills | Status: DC

## 2020-02-10 NOTE — Assessment & Plan Note (Signed)
Reduced Hgb to 17, reduced HCT back to high end of normal range. EPO normal. Negative peripheral smear. No new symptoms  Still active some day smoker, chronic history 40 years. Can be attributed to smoking Gradual increase in past few years.  Future consider hematology if concerning or recurrent problem.

## 2020-02-10 NOTE — Progress Notes (Signed)
Subjective:    Patient ID: Vincent Dunn, male    DOB: [birth year], 59 y.o.   MRN: [id number]  Vincent Dunn is a 59 y.o. male presenting on 02/10/2020 for Gout (Right foot onset today--6 month )   HPI   Acute Gout Flare / Right Ankle Pain Last visit 10/2019 for acute flare, resolved w/ prednisone Prior Uric acid 8 range Now last lab 01/2020 showed Uric Acid 9.8 Today he can feel it coming on, if drink a ton of water 2-3 days can usually reduce it has mild flare symptoms R forefoot and great toe fairly often. Also ankle Taking Ibuprofen 400-800mg  PRN Improving diet. Occasionally eats some higher gout risk foods Not on preventative med prior Denies other joint pain or involvement or injury  Hypothyroidism Last lab now 01/2020 showed normalized TSH Free T4 Taking levothyroxine  Elevated Hemoglobin - Prior trend up from 14-15 now up to 16-17 on Hgb over past few years Last lab now normalized Hgb 17. Normal EPO. Unremarkable peripheral smear. Active smoker Denies symptoms  Chronic Generalized Anxiety with panic attacks See last note for background. Continues on chronic Klonopin 0.75mg  BID with good results for his insomnia, had been on for >30 years No recent acute panic attacks. Failed SSRI in past lexapro, sertraline   Depression screen [facility] 2/9 02/10/2020 11/04/2019 08/13/2019  Decreased Interest 0 0 0  Down, Depressed, Hopeless 3 0 0  PHQ - 2 Score 3 0 0  Altered sleeping 0 - 0  Tired, decreased energy 0 - 1  Change in appetite 0 - 0  Feeling bad or failure about yourself  0 - 0  Trouble concentrating 0 - 0  Moving slowly or fidgety/restless 0 - 0  Suicidal thoughts 0 - 0  PHQ-9 Score 3 - 1  Difficult doing work/chores Not difficult at all - Not difficult at all    Social History   Tobacco Use  . Smoking status: Current Every Day Smoker    Packs/day: 0.50    Years: 40.00    Pack years: 20.00  . Smokeless tobacco: Current User  Substance Use Topics  . Alcohol  use: Yes    Alcohol/week: 10.0 standard drinks    Types: 10 Standard drinks or equivalent per week  . Drug use: Never    Review of Systems Per HPI unless specifically indicated above     Objective:    BP 130/81   Pulse 72   Temp (!) 96.9 F (36.1 C) (Temporal)   Resp 16   Ht 5\' 10"  (1.778 m)   Wt 198 lb (89.8 kg)   BMI 28.41 kg/m   Wt Readings from Last 3 Encounters:  02/10/20 198 lb (89.8 kg)  08/13/19 197 lb 9.6 oz (89.6 kg)  07/15/19 194 lb (88 kg)    Physical Exam Vitals and nursing note reviewed.  Constitutional:      General: He is not in acute distress.    Appearance: He is well-developed. He is not diaphoretic.     Comments: Well-appearing, comfortable, cooperative  HENT:     Head: Normocephalic and atraumatic.  Eyes:     General:        Right eye: No discharge.        Left eye: No discharge.     Conjunctiva/sclera: Conjunctivae normal.  Cardiovascular:     Rate and Rhythm: Normal rate.  Pulmonary:     Effort: Pulmonary effort is normal.  Musculoskeletal:     Comments: R foot mild  non pitting edema forefoot, plantar, no erythema, mild tender  Skin:    General: Skin is warm and dry.     Findings: No erythema or rash.  Neurological:     Mental Status: He is alert and oriented to person, place, and time.  Psychiatric:        Behavior: Behavior normal.     Comments: Well groomed, good eye contact, normal speech and thoughts    Results for orders placed or performed in visit on 02/03/20  Uric acid  Result Value Ref Range   Uric Acid, Serum 9.8 (H) 4.0 - 8.0 mg/dL  [facility] - T4, free  Result Value Ref Range   Free T4 1.4 0.8 - 1.8 ng/dL  [facility] - TSH  Result Value Ref Range   TSH 1.10 0.40 - 4.50 mIU/L  Erythropoietin  Result Value Ref Range   Erythropoietin 5.1 2.6 - 18.5 mIU/mL  [facility] - Pathologist smear review (blood)  Result Value Ref Range   Path Review    [facility] - CBC with Differential/Platelet physical  Result Value Ref Range   WBC 7.0 3.8 -  10.8 Thousand/uL   RBC 5.21 4.20 - 5.80 Million/uL   Hemoglobin 17.0 13.2 - 17.1 g/dL   HCT 50.2 (H) 38.5 - 50.0 %   MCV 96.4 80.0 - 100.0 fL   MCH 32.6 27.0 - 33.0 pg   MCHC 33.9 32.0 - 36.0 g/dL   RDW 12.8 11.0 - 15.0 %   Platelets 238 140 - 400 Thousand/uL   MPV 10.5 7.5 - 12.5 fL   Neutro Abs 4,718 1,500 - 7,800 cells/uL   Lymphs Abs 1,400 850 - 3,900 cells/uL   Absolute Monocytes 616 200 - 950 cells/uL   Eosinophils Absolute 217 15 - 500 cells/uL   Basophils Absolute 49 0 - 200 cells/uL   Neutrophils Relative % 67.4 %   Total Lymphocyte 20.0 %   Monocytes Relative 8.8 %   Eosinophils Relative 3.1 %   Basophils Relative 0.7 %      Assessment & Plan:   Problem List Items Addressed This Visit    Generalized anxiety disorder with panic attacks    Controlled, remains improved on current Clonazepam BDZ dosing 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 - No complications or withdrawal or other red flags on BDZ No reported co morbid depression - No prior dx / Psych / counseling  Checked PDMP  Plan: 1. Continue current therapy Future consider therapist/counseling  Mental health eval      Elevated hemoglobin (HCC)    Reduced Hgb to 17, reduced HCT back to high end of normal range. EPO normal. Negative peripheral smear. No new symptoms  Still active some day smoker, chronic history 40 years. Can be attributed to smoking Gradual increase in past few years.  Future consider hematology if concerning or recurrent problem.      Chronic gout of right foot - Primary    Concern for chronic recurrent gout, mild acute flare possibly developing Last flare L ankle 10/2019 Tried NSAID limited relief, similar to prior gout flare resolved w/ prednisone Known chronic recurrent gout Not on uric acid lowering therapy Uric Acid at 9.8  (01/2020), prior 8  Plan: 1. Start Allopurinol 100mg  daily prophylaxis 2. Start Colchicine 0.6mg  daily concurrent therapy/prophylaxis avoid precipitating flare - Discuss benefits risk side effect dosing, may double dose  Colchicine if needed to BID can also inc Allopurinol to 200-300 if need in future - Goal duration on colchicine 3-6 month - If doing well can re-check Uric Acid in 3 months and may DC Colchicine and use PRN ONLY - HOLD NSAID while on colchicine - if develop acute flare now in next 48-72 hours, call and we can HOLD Colchicine and START Prednisone or NSAID Avoid food triggers (red meat, alcohol) - goal to limit triggers to avoid adding prophylaxis medication in future, discussed can reconsider if interested Follow-up as need if not improve  Recheck uric acid 3 months      Relevant Medications   allopurinol (ZYLOPRIM) 100 MG tablet   colchicine 0.6 MG tablet   Other Relevant Orders   Uric acid   Acquired hypothyroidism    Normalized TSH thyroid panel Clinically controlled, chronic hypothyroidism Continue current dose Levothyroxine daily Re-check labs in 6 months         Meds ordered this encounter  Medications  . allopurinol (ZYLOPRIM) 100 MG tablet    Sig: Take 1 tablet (100 mg total) by mouth daily.    Dispense:  30 tablet    Refill:  2  . colchicine 0.6 MG tablet    Sig: Take 1 tablet (0.6 mg total) by mouth daily.    Dispense:  30 tablet    Refill:  2    If cost is too high or not covered, notify doctors office if need to change rx      Follow up plan: Return in about 3 months (around 05/12/2020) for 3 month gout follow-up, lab result.  Future lab in 3 months. Uric Acid.  Saralyn Pilar, DO [facility] 02/10/2020, 4:28 PM

## 2020-02-10 NOTE — Assessment & Plan Note (Signed)
Normalized TSH thyroid panel Clinically controlled, chronic hypothyroidism Continue current dose Levothyroxine daily Re-check labs in 6 months

## 2020-02-10 NOTE — Patient Instructions (Addendum)
Thank you for coming to the office today.  Uric Acid 9.8, elevated now.  Start Allopurinol 100mg  daily for prevention, long term med to reduce uric acid.  Start Colchicine 0.6mg  once daily - gout only treatment, should be taken daily with allopurinol, for first at least 3 months approximately  After 3 months if NO problems can STOP colchicine completely.  In future IF acute gout flare - Can restart Colchicine - First dose take 2 tabs (1.2mg  total), wait 1 hour then take 1 tab (0.6mg ). Then next day start with 1 tab daily until pain resolves, if after 3 days pain is not resolving then you can increase to 1 tab twice a day until resolved. Continue same dose you were on when pain resolved for about 2-3 days AFTER pain is completely gone.   If acute severe gout flare, can call and stop colchicine and START a prednisone course if need.  May have to double dose of Colchicine to twice a day, same with allopurinol up to 200mg  in future.  Re-check blood 3 months for Uric Acid.  Please schedule a Follow-up Appointment to: Return in about 3 months (around 05/12/2020) for 3 month gout follow-up, lab result.  If you have any other questions or concerns, please feel free to call the office or send a message through MyChart. You may also schedule an earlier appointment if necessary.  Additionally, you may be receiving a survey about your experience at our office within a few days to 1 week by e-mail or mail. We value your feedback.  , DO [facility], Saralyn Pilar

## 2020-02-10 NOTE — Assessment & Plan Note (Signed)
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 - No complications or withdrawal or other red flags on BDZ No reported co morbid depression - No prior dx / Psych / counseling  Checked PDMP  Plan: 1. Continue current therapy Future consider therapist/counseling  Mental health eval

## 2020-02-10 NOTE — Assessment & Plan Note (Addendum)
Concern for chronic recurrent gout, mild acute flare possibly developing Last flare L ankle 10/2019 Tried NSAID limited relief, similar to prior gout flare resolved w/ prednisone Known chronic recurrent gout Not on uric acid lowering therapy Uric Acid at 9.8 (01/2020), prior 8  Plan: 1. Start Allopurinol 100mg  daily prophylaxis 2. Start Colchicine 0.6mg  daily concurrent therapy/prophylaxis avoid precipitating flare - Discuss benefits risk side effect dosing, may double dose Colchicine if needed to BID can also inc Allopurinol to 200-300 if need in future - Goal duration on colchicine 3-6 month - If doing well can re-check Uric Acid in 3 months and may DC Colchicine and use PRN ONLY - HOLD NSAID while on colchicine - if develop acute flare now in next 48-72 hours, call and we can HOLD Colchicine and START Prednisone or NSAID Avoid food triggers (red meat, alcohol) - goal to limit triggers to avoid adding prophylaxis medication in future, discussed can reconsider if interested Follow-up as need if not improve  Recheck uric acid 3 months

## 2020-04-01 ENCOUNTER — Ambulatory Visit (INDEPENDENT_AMBULATORY_CARE_PROVIDER_SITE_OTHER): Payer: 59 | Admitting: Family Medicine

## 2020-04-01 ENCOUNTER — Encounter: Payer: Self-pay | Admitting: Family Medicine

## 2020-04-01 ENCOUNTER — Other Ambulatory Visit: Payer: Self-pay

## 2020-04-01 ENCOUNTER — Other Ambulatory Visit: Payer: Self-pay | Admitting: Family Medicine

## 2020-04-01 DIAGNOSIS — M25511 Pain in right shoulder: Secondary | ICD-10-CM | POA: Diagnosis not present

## 2020-04-01 DIAGNOSIS — G8929 Other chronic pain: Secondary | ICD-10-CM

## 2020-04-01 DIAGNOSIS — M1A0710 Idiopathic chronic gout, right ankle and foot, without tophus (tophi): Secondary | ICD-10-CM | POA: Diagnosis not present

## 2020-04-01 DIAGNOSIS — F411 Generalized anxiety disorder: Secondary | ICD-10-CM

## 2020-04-01 DIAGNOSIS — M1A09X0 Idiopathic chronic gout, multiple sites, without tophus (tophi): Secondary | ICD-10-CM

## 2020-04-01 DIAGNOSIS — F410 Panic disorder [episodic paroxysmal anxiety] without agoraphobia: Secondary | ICD-10-CM

## 2020-04-01 DIAGNOSIS — R7309 Other abnormal glucose: Secondary | ICD-10-CM

## 2020-04-01 DIAGNOSIS — Z0000 Encounter for general adult medical examination without abnormal findings: Secondary | ICD-10-CM

## 2020-04-01 DIAGNOSIS — E781 Pure hyperglyceridemia: Secondary | ICD-10-CM

## 2020-04-01 DIAGNOSIS — M25512 Pain in left shoulder: Secondary | ICD-10-CM

## 2020-04-01 DIAGNOSIS — Z125 Encounter for screening for malignant neoplasm of prostate: Secondary | ICD-10-CM

## 2020-04-01 DIAGNOSIS — D582 Other hemoglobinopathies: Secondary | ICD-10-CM

## 2020-04-01 DIAGNOSIS — E039 Hypothyroidism, unspecified: Secondary | ICD-10-CM

## 2020-04-01 MED ORDER — COLCHICINE 0.6 MG PO TABS: 0.6000 mg | ORAL_TABLET | Freq: Every day | ORAL | 3 refills | Status: DC | PRN

## 2020-04-01 MED ORDER — CLONAZEPAM 0.5 MG PO TABS: 0.7500 mg | ORAL_TABLET | Freq: Two times a day (BID) | ORAL | 3 refills | Status: DC

## 2020-04-01 MED ORDER — CYCLOBENZAPRINE HCL 10 MG PO TABS: 10.0000 mg | ORAL_TABLET | Freq: Every day | ORAL | 3 refills | Status: DC

## 2020-04-01 MED ORDER — ALLOPURINOL 100 MG PO TABS: 200.0000 mg | ORAL_TABLET | Freq: Every day | ORAL | 3 refills | Status: DC

## 2020-04-01 NOTE — Progress Notes (Signed)
Subjective:    Patient ID: Vincent Dunn, male    DOB: May 30, 1961, 59 y.o.   MRN: [id number]  Vincent Dunn is a 59 y.o. male presenting on 04/01/2020 for Anxiety   HPI   Chronic Gout, without flare Bilateral feet/ankles toes had been affected. Prior history reviewed had multiples flares in past, previously 2-3 flares in 3-4 months early 2021. Last seen 02/2020 treated with new start prophylaxis Allopurinol 100mg  daily and also colchicine at same time for period of time. Has done well without flare since that time. Now he is only taking Allopurinol daily. May still have mild early flare symptoms, he takes Colchicine 0.6mg  once a day for 1-3 days at a time, about every other week with good results. - Last uric acid was 9.8 on 02/03/20 prior to starting uric acid. - He has not taken as much ibuprofen. - Diet improved to avoid gout trigger foods Denies other joint pain or involvement or injury  Chronic Bilateral Shoulder Pain Worse at night at times. Overall doing well now and improved on Flexeril 10mg  nightly every night with good results. Ready for refill  Chronic Generalized Anxiety with panic attacks See last note for background. Continues on chronic Klonopin 0.75mg  BID with good results for his insomnia, had been on for >30 years No recent acute panic attacks. Failed SSRI in past lexapro, sertraline   Health Maintenance:  Due COVID19 vaccine, he will work on scheduling this.  Depression screen [facility] 2/9 04/01/2020 02/10/2020 11/04/2019  Decreased Interest 0 0 0  Down, Depressed, Hopeless 0 3 0  PHQ - 2 Score 0 3 0  Altered sleeping - 0 -  Tired, decreased energy - 0 -  Change in appetite - 0 -  Feeling bad or failure about yourself  - 0 -  Trouble concentrating - 0 -  Moving slowly or fidgety/restless - 0 -  Suicidal thoughts - 0 -  PHQ-9 Score - 3 -  Difficult doing work/chores - Not difficult at all -   GAD 7 : Generalized Anxiety Score 04/01/2020 02/10/2020 08/13/2019 07/15/2019    Nervous, Anxious, on Edge 0 0 0 1  Control/stop worrying 0 0 0 0  Worry too much - different things 0 0 0 1  Trouble relaxing 0 0 0 0  Restless 0 0 0 0  Easily annoyed or irritable 0 0 0 0  Afraid - awful might happen 0 0 0 0  Total GAD 7 Score 0 0 0 2  Anxiety Difficulty Not difficult at all Not difficult at all - Not difficult at all      Social History   Tobacco Use  . Smoking status: Current Every Day Smoker    Packs/day: 0.50    Years: 40.00    Pack years: 20.00  . Smokeless tobacco: Current User  Substance Use Topics  . Alcohol use: Yes    Alcohol/week: 10.0 standard drinks    Types: 10 Standard drinks or equivalent per week  . Drug use: Never    Review of Systems Per HPI unless specifically indicated above     Objective:    BP (!) 141/85   Pulse 65   Temp (!) 97.5 F (36.4 C) (Temporal)   Resp 16   Ht 5\' 10"  (1.778 m)   Wt 196 lb 9.6 oz (89.2 kg)   SpO2 95%   BMI 28.21 kg/m   Wt Readings from Last 3 Encounters:  04/01/20 196 lb 9.6 oz (89.2 kg)  02/10/20 198 lb (  89.8 kg)  08/13/19 197 lb 9.6 oz (89.6 kg)    Physical Exam Vitals and nursing note reviewed.  Constitutional:      General: He is not in acute distress.    Appearance: He is well-developed. He is not diaphoretic.     Comments: Well-appearing, comfortable, cooperative  HENT:     Head: Normocephalic and atraumatic.  Eyes:     General:        Right eye: No discharge.        Left eye: No discharge.     Conjunctiva/sclera: Conjunctivae normal.  Cardiovascular:     Rate and Rhythm: Normal rate.  Pulmonary:     Effort: Pulmonary effort is normal.  Skin:    General: Skin is warm and dry.     Findings: No erythema or rash.  Neurological:     Mental Status: He is alert and oriented to person, place, and time.  Psychiatric:        Behavior: Behavior normal.     Comments: Well groomed, good eye contact, normal speech and thoughts    Results for orders placed or performed in visit on  02/03/20  Uric acid  Result Value Ref Range   Uric Acid, Serum 9.8 (H) 4.0 - 8.0 mg/dL  [facility] - T4, free  Result Value Ref Range   Free T4 1.4 0.8 - 1.8 ng/dL  [facility] - TSH  Result Value Ref Range   TSH 1.10 0.40 - 4.50 mIU/L  Erythropoietin  Result Value Ref Range   Erythropoietin 5.1 2.6 - 18.5 mIU/mL  [facility] - Pathologist smear review (blood)  Result Value Ref Range   Path Review    [facility] - CBC with Differential/Platelet physical  Result Value Ref Range   WBC 7.0 3.8 - 10.8 Thousand/uL   RBC 5.21 4.20 - 5.80 Million/uL   Hemoglobin 17.0 13.2 - 17.1 g/dL   HCT 50.2 (H) 38 - 50 %   MCV 96.4 80.0 - 100.0 fL   MCH 32.6 27.0 - 33.0 pg   MCHC 33.9 32.0 - 36.0 g/dL   RDW 12.8 11.0 - 15.0 %   Platelets 238 140 - 400 Thousand/uL   MPV 10.5 7.5 - 12.5 fL   Neutro Abs 4,718 1,500 - 7,800 cells/uL   Lymphs Abs 1,400 850 - 3,900 cells/uL   Absolute Monocytes 616 200 - 950 cells/uL   Eosinophils Absolute 217 15 - 500 cells/uL   Basophils Absolute 49 0 - 200 cells/uL   Neutrophils Relative % 67.4 %   Total Lymphocyte 20.0 %   Monocytes Relative 8.8 %   Eosinophils Relative 3.1 %   Basophils Relative 0.7 %      Assessment & Plan:   Problem List Items Addressed This Visit    Generalized anxiety disorder with panic attacks    Controlled, remains improved on current Clonazepam BDZ dosing 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 - No complications or withdrawal or other red flags on BDZ No reported co morbid depression - No prior dx / Psych / counseling  Checked PDMP  Plan: 1. Continue current therapy - refill today Future consider therapist/counseling  Mental health eval      Relevant Medications   clonazePAM (KLONOPIN) 0.5 MG tablet   Chronic pain of both shoulders    Stable chronic rotator cuff impingement, repetitive  work Improved on nightly  flexeril Refill       Relevant Medications   cyclobenzaprine (FLEXERIL) 10 MG tablet   clonazePAM (KLONOPIN) 0.5 MG tablet   Chronic gout    Significantly improved chronic recurrent gout of bilateral feet/ankles No actual flare in past 2+ months since started allopurinol prophylaxis Only mild early symptoms, resolve with colchicine occasionally Last Uric Acid at 9.8 (01/2020), prior 8  Plan: 1. INCREASE Allopurinol from 100 to 200mg  daily prophylaxis, new rx sent 2. Continue Colchicine 0.6mg  PRN early flare. Can repeat dose in 2 hours on day 1, then can take daily PRN for 1-5 days approx Avoid food triggers (red meat, alcohol) - goal to limit triggers to avoid adding prophylaxis medication in future, discussed can reconsider if interested Follow-up as need if not improve  Recheck uric acid 4 months from now with physical      Relevant Medications   colchicine 0.6 MG tablet   allopurinol (ZYLOPRIM) 100 MG tablet      Meds ordered this encounter  Medications  . cyclobenzaprine (FLEXERIL) 10 MG tablet    Sig: Take 1 tablet (10 mg total) by mouth at bedtime.    Dispense:  90 tablet    Refill:  3  . colchicine 0.6 MG tablet    Sig: Take 1 tablet (0.6 mg total) by mouth daily as needed (gout flare). May repeat dose for 1 pill 2 hours later if need, then can continue daily for up to 3-5 days as needed.    Dispense:  30 tablet    Refill:  3  . allopurinol (ZYLOPRIM) 100 MG tablet    Sig: Take 2 tablets (200 mg total) by mouth daily.    Dispense:  60 tablet    Refill:  3  . clonazePAM (KLONOPIN) 0.5 MG tablet    Sig: Take 1.5 tablets (0.75 mg total) by mouth 2 (two) times daily.    Dispense:  90 tablet    Refill:  3      Follow up plan: Return in about 4 months (around 08/01/2020) for Annual Physical.  Future labs ordered for 07/27/2020 - 4 months, will do annual physical with all labs including thyroid and uric acid for gout.   07/29/2020, DO [facility] 04/01/2020, 8:41 AM

## 2020-04-01 NOTE — Assessment & Plan Note (Signed)
Stable chronic rotator cuff impingement, repetitive work Improved on nightly flexeril Refill

## 2020-04-01 NOTE — Assessment & Plan Note (Signed)
Significantly improved chronic recurrent gout of bilateral feet/ankles No actual flare in past 2+ months since started allopurinol prophylaxis Only mild early symptoms, resolve with colchicine occasionally Last Uric Acid at 9.8 (01/2020), prior 8  Plan: 1. INCREASE Allopurinol from 100 to 200mg  daily prophylaxis, new rx sent 2. Continue Colchicine 0.6mg  PRN early flare. Can repeat dose in 2 hours on day 1, then can take daily PRN for 1-5 days approx Avoid food triggers (red meat, alcohol) - goal to limit triggers to avoid adding prophylaxis medication in future, discussed can reconsider if interested Follow-up as need if not improve  Recheck uric acid 4 months from now with physical

## 2020-04-01 NOTE — Patient Instructions (Addendum)
Thank you for coming to the office today.  Refilled all meds  Increase Allopurinol from 100 daily to 200mg  daily. See if this helps reduce how often you get early symptoms or how often you take the back up colchicine.  Will check uric acid gout level next time.  DUE for FASTING BLOOD WORK (no food or drink after midnight before the lab appointment, only water or coffee without cream/sugar on the morning of)  SCHEDULE "Lab Only" visit in the morning at the clinic for lab draw in 4 MONTHS   - Make sure Lab Only appointment is at about 1 week before your next appointment, so that results will be available  For Lab Results, once available within 2-3 days of blood draw, you can can log in to MyChart online to view your results and a brief explanation. Also, we can discuss results at next follow-up visit.   Please schedule a Follow-up Appointment to: Return in about 4 months (around 08/01/2020) for Annual Physical.  If you have any other questions or concerns, please feel free to call the office or send a message through MyChart. You may also schedule an earlier appointment if necessary.  Additionally, you may be receiving a survey about your experience at our office within a few days to 1 week by e-mail or mail. We value your feedback.  08/03/2020, DO [facility], [facility]

## 2020-04-01 NOTE — Assessment & Plan Note (Signed)
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 - No complications or withdrawal or other red flags on BDZ No reported co morbid depression - No prior dx / Psych / counseling  Checked PDMP  Plan: 1. Continue current therapy - refill today Future consider therapist/counseling  Mental health eval

## 2020-05-04 ENCOUNTER — Other Ambulatory Visit: Payer: Self-pay | Admitting: Family Medicine

## 2020-05-04 DIAGNOSIS — M1A0710 Idiopathic chronic gout, right ankle and foot, without tophus (tophi): Secondary | ICD-10-CM

## 2020-05-04 NOTE — Telephone Encounter (Signed)
Declined refill for the previous dosing instructions Colchicine 0.6mg  daily to prevent flare while initiating allopurinol only.  I have ordered his PRN dosing new instructions on 04/01/20 already with refills.  He should not need further refills. No longer taking it daily.  Saralyn Pilar, DO [facility] 05/04/2020, 12:20 PM

## 2020-05-04 NOTE — Telephone Encounter (Signed)
Requested medication (s) are due for refill today: yes  Requested medication (s) are on the active medication list: yes  Last refill:  04/01/20  #30  3 refills  Future visit scheduled: yes  Notes to clinic:  failed because of high uric acid value. Request came from pharmacy. Is patient taking this as directed? He had 3 refills?    Requested Prescriptions  Pending Prescriptions Disp Refills   colchicine 0.6 MG tablet [Pharmacy Med Name: COLCHICINE 0.6MG  TABLETS] 30 tablet 3    Sig: TAKE 1 TABLET(0.6 MG) BY MOUTH DAILY      Endocrinology:  Gout Agents Failed - 05/04/2020  9:02 AM      Failed - Uric Acid in normal range and within 360 days    Uric Acid, Serum  Date Value Ref Range Status  02/03/2020 9.8 (H) 4.0 - 8.0 mg/dL Final    Comment:    Therapeutic target for gout patients: <6.0 mg/dL .           Passed - Cr in normal range and within 360 days    Creat  Date Value Ref Range Status  08/06/2019 0.87 0.70 - 1.25 mg/dL Final    Comment:    For patients >29 years of age, the reference limit for Creatinine is approximately 13% higher for people identified as African-American. Verna Czech - Valid encounter within last 12 months    Recent Outpatient Visits           1 month ago Chronic pain of both shoulders   [facility] [place], [name], DO   2 months ago Chronic gout of right foot, unspecified cause   [facility] [place], [name], DO   6 months ago Acute gout of left ankle, unspecified cause   [facility] [place], [name], DO   8 months ago Annual physical exam   [facility] Smitty Cords, DO   9 months ago Generalized anxiety disorder with panic attacks   [facility] Charon, [name], DO       Future Appointments             In 3 months Althea Charon, [name], DO [facility], [facility]

## 2020-06-03 ENCOUNTER — Ambulatory Visit: Payer: Self-pay

## 2020-06-03 NOTE — Telephone Encounter (Signed)
  Pt. Reports he has been working outside today and came home 30 minutes ago. Has chest pain, dizziness, "some shortness of breath" Pain is on right side. States "I think I pulled something unloading a container at work." Instructed pt. To go to ED for evaluation. States "I'll see how I feel in a few minutes." Instructed he needs to have his wife take him now. Verbalizes understanding. Reason for Disposition . Patient sounds very sick or weak to the triager  Answer Assessment - Initial Assessment Questions 1. LOCATION: "Where does it hurt?"       Right 2. RADIATION: "Does the pain go anywhere else?" (e.g., into neck, jaw, arms, back)     No 3. ONSET: "When did the chest pain begin?" (Minutes, hours or days)      1:15 4. PATTERN "Does the pain come and go, or has it been constant since it started?"  "Does it get worse with exertion?"      Constant 5. DURATION: "How long does it last" (e.g., seconds, minutes, hours)     Hour 6. SEVERITY: "How bad is the pain?"  (e.g., Scale 1-10; mild, moderate, or severe)    - MILD (1-3): doesn't interfere with normal activities     - MODERATE (4-7): interferes with normal activities or awakens from sleep    - SEVERE (8-10): excruciating pain, unable to do any normal activities        6-7 7. CARDIAC RISK FACTORS: "Do you have any history of heart problems or risk factors for heart disease?" (e.g., angina, prior heart attack; diabetes, high blood pressure, high cholesterol, smoker, or strong family history of heart disease)     No 8. PULMONARY RISK FACTORS: "Do you have any history of lung disease?"  (e.g., blood clots in lung, asthma, emphysema, birth control pills)     No 9. CAUSE: "What do you think is causing the chest pain?"     Unsure 10. OTHER SYMPTOMS: "Do you have any other symptoms?" (e.g., dizziness, nausea, vomiting, sweating, fever, difficulty breathing, cough)       Dizzy, shortness of breath 11. PREGNANCY: "Is there any chance you are  pregnant?" "When was your last menstrual period?"       n/a  Protocols used: CHEST PAIN-A-AH

## 2020-06-27 ENCOUNTER — Other Ambulatory Visit: Payer: Self-pay | Admitting: Family Medicine

## 2020-06-27 DIAGNOSIS — M1A0710 Idiopathic chronic gout, right ankle and foot, without tophus (tophi): Secondary | ICD-10-CM

## 2020-07-01 ENCOUNTER — Other Ambulatory Visit: Payer: Self-pay | Admitting: Family Medicine

## 2020-07-01 DIAGNOSIS — E039 Hypothyroidism, unspecified: Secondary | ICD-10-CM

## 2020-07-27 ENCOUNTER — Other Ambulatory Visit: Payer: Self-pay

## 2020-07-27 ENCOUNTER — Other Ambulatory Visit: Payer: 59

## 2020-07-27 DIAGNOSIS — Z0000 Encounter for general adult medical examination without abnormal findings: Secondary | ICD-10-CM

## 2020-07-27 DIAGNOSIS — M1A09X0 Idiopathic chronic gout, multiple sites, without tophus (tophi): Secondary | ICD-10-CM

## 2020-07-27 DIAGNOSIS — Z125 Encounter for screening for malignant neoplasm of prostate: Secondary | ICD-10-CM

## 2020-07-27 DIAGNOSIS — F410 Panic disorder [episodic paroxysmal anxiety] without agoraphobia: Secondary | ICD-10-CM

## 2020-07-27 DIAGNOSIS — E039 Hypothyroidism, unspecified: Secondary | ICD-10-CM

## 2020-07-27 DIAGNOSIS — D582 Other hemoglobinopathies: Secondary | ICD-10-CM

## 2020-07-27 DIAGNOSIS — E781 Pure hyperglyceridemia: Secondary | ICD-10-CM

## 2020-07-27 DIAGNOSIS — R7309 Other abnormal glucose: Secondary | ICD-10-CM

## 2020-07-28 LAB — CBC WITH DIFFERENTIAL/PLATELET
Absolute Monocytes: 631 cells/uL (ref 200–950)
Basophils Absolute: 69 cells/uL (ref 0–200)
Basophils Relative: 0.9 %
Eosinophils Absolute: 193 cells/uL (ref 15–500)
Eosinophils Relative: 2.5 %
HCT: 48 % (ref 38.5–50.0)
Hemoglobin: 16.3 g/dL (ref 13.2–17.1)
Lymphs Abs: 1609 cells/uL (ref 850–3900)
MCH: 32.4 pg (ref 27.0–33.0)
MCHC: 34 g/dL (ref 32.0–36.0)
MCV: 95.4 fL (ref 80.0–100.0)
MPV: 10.5 fL (ref 7.5–12.5)
Monocytes Relative: 8.2 %
Neutro Abs: 5198 cells/uL (ref 1500–7800)
Neutrophils Relative %: 67.5 %
Platelets: 256 10*3/uL (ref 140–400)
RBC: 5.03 10*6/uL (ref 4.20–5.80)
RDW: 13.5 % (ref 11.0–15.0)
Total Lymphocyte: 20.9 %
WBC: 7.7 10*3/uL (ref 3.8–10.8)

## 2020-07-28 LAB — COMPLETE METABOLIC PANEL WITH GFR
AG Ratio: 2 (calc) (ref 1.0–2.5)
ALT: 41 U/L (ref 9–46)
AST: 29 U/L (ref 10–35)
Albumin: 4.2 g/dL (ref 3.6–5.1)
Alkaline phosphatase (APISO): 86 U/L (ref 35–144)
BUN: 8 mg/dL (ref 7–25)
CO2: 22 mmol/L (ref 20–32)
Calcium: 8.8 mg/dL (ref 8.6–10.3)
Chloride: 106 mmol/L (ref 98–110)
Creat: 0.92 mg/dL (ref 0.70–1.25)
GFR, Est African American: 104 mL/min/{1.73_m2} (ref >=60)
GFR, Est Non African American: 89 mL/min/{1.73_m2} (ref >=60)
Globulin: 2.1 g/dL (calc) (ref 1.9–3.7)
Glucose, Bld: 92 mg/dL (ref 65–99)
Potassium: 4.1 mmol/L (ref 3.5–5.3)
Sodium: 137 mmol/L (ref 135–146)
Total Bilirubin: 0.5 mg/dL (ref 0.2–1.2)
Total Protein: 6.3 g/dL (ref 6.1–8.1)

## 2020-07-28 LAB — LIPID PANEL
Cholesterol: 185 mg/dL (ref <200)
HDL: 39 mg/dL — ABNORMAL LOW (ref >=40)
LDL Cholesterol (Calc): 109 mg/dL (calc) — ABNORMAL HIGH
Non-HDL Cholesterol (Calc): 146 mg/dL (calc) — ABNORMAL HIGH (ref <130)
Total CHOL/HDL Ratio: 4.7 (calc) (ref <5.0)
Triglycerides: 246 mg/dL — ABNORMAL HIGH (ref <150)

## 2020-07-28 LAB — T4, FREE: Free T4: 1.6 ng/dL (ref 0.8–1.8)

## 2020-07-28 LAB — URIC ACID: Uric Acid, Serum: 6.1 mg/dL (ref 4.0–8.0)

## 2020-07-28 LAB — HEMOGLOBIN A1C
Hgb A1c MFr Bld: 5.4 % of total Hgb (ref <5.7)
Mean Plasma Glucose: 108 (calc)
eAG (mmol/L): 6 (calc)

## 2020-07-28 LAB — PSA: PSA: 0.62 ng/mL (ref <=4.0)

## 2020-07-28 LAB — TSH: TSH: 0.63 mIU/L (ref 0.40–4.50)

## 2020-08-01 ENCOUNTER — Other Ambulatory Visit: Payer: Self-pay | Admitting: Family Medicine

## 2020-08-01 DIAGNOSIS — F410 Panic disorder [episodic paroxysmal anxiety] without agoraphobia: Secondary | ICD-10-CM

## 2020-08-01 DIAGNOSIS — M1A0710 Idiopathic chronic gout, right ankle and foot, without tophus (tophi): Secondary | ICD-10-CM

## 2020-08-01 MED ORDER — CLONAZEPAM 0.5 MG PO TABS: 0.7500 mg | ORAL_TABLET | Freq: Two times a day (BID) | ORAL | 2 refills | Status: DC

## 2020-08-01 NOTE — Telephone Encounter (Signed)
Medication Refill - Medication: Allopurinol and Clonazepam  Has the patient contacted their pharmacy? No. (Agent: If no, request that the patient contact the pharmacy for the refill.) (Agent: If yes, when and what did the pharmacy advise?)  Preferred Pharmacy (with phone number or street name): [id number] [place], [place] - [address] AT [facility]  Agent: Please be advised that RX refills may take up to 3 business days. We ask that you follow-up with your pharmacy.

## 2020-08-01 NOTE — Telephone Encounter (Signed)
Requested Prescriptions  Pending Prescriptions Disp Refills  . allopurinol (ZYLOPRIM) 100 MG tablet 180 tablet     Sig: TAKE 2 TABLETS(200 MG) BY MOUTH DAILY     Endocrinology:  Gout Agents Passed - 08/01/2020  1:11 PM      Passed - Uric Acid in normal range and within 360 days    Uric Acid, Serum  Date Value Ref Range Status  07/27/2020 6.1 4.0 - 8.0 mg/dL Final    Comment:    Therapeutic target for gout patients: <6.0 mg/dL .          Passed - Cr in normal range and within 360 days    Creat  Date Value Ref Range Status  07/27/2020 0.92 0.70 - 1.25 mg/dL Final    Comment:    For patients >61 years of age, the reference limit for Creatinine is approximately 13% higher for people identified as African-American. Verna Czech - Valid encounter within last 12 months    Recent Outpatient Visits          4 months ago Chronic pain of both shoulders   [facility] [place], [name], DO   5 months ago Chronic gout of right foot, unspecified cause   [facility] [place], [name], DO   9 months ago Acute gout of left ankle, unspecified cause   [facility] Smitty Cords, DO   11 months ago Annual physical exam   [facility] Smitty Cords, DO   1 year ago Generalized anxiety disorder with panic attacks   [facility] Al[facility] Charon, [name], DO      Future Appointments            In 2 days Althea Charon, [name], DO [facility], [facility]           . clonazePAM (KLONOPIN) 0.5 MG tablet 90 tablet     Sig: Take 1.5 tablets (0.75 mg total) by mouth 2 (two) times daily.     Not Delegated - Psychiatry:  Anxiolytics/Hypnotics Failed - 08/01/2020  1:11 PM      Failed - This refill cannot be delegated      Failed - Urine Drug Screen completed in last 360 days.      Passed - Valid encounter within last 6 months    Recent Outpatient Visits           4 months ago Chronic pain of both shoulders   [facility] [place], [name], DO   5 months ago Chronic gout of right foot, unspecified cause   [facility] [place], [name], DO   9 months ago Acute gout of left ankle, unspecified cause   [facility] Smitty Cords, DO   11 months ago Annual physical exam   [facility] Smitty Cords, DO   1 year ago Generalized anxiety disorder with panic attacks   [facility] Althe[facility] Charon, [name], DO      Future Appointments            In 2 days Althea Charon, [name], DO [facility], [facility]

## 2020-08-01 NOTE — Telephone Encounter (Signed)
Requested medication (s) are due for refill today: yes  Requested medication (s) are on the active medication list: yes  Last refill:  04/01/20  Future visit scheduled: yes  Notes to clinic:  med not delegated to NT to RF   Requested Prescriptions  Pending Prescriptions Disp Refills   clonazePAM (KLONOPIN) 0.5 MG tablet 90 tablet     Sig: Take 1.5 tablets (0.75 mg total) by mouth 2 (two) times daily.      Not Delegated - Psychiatry:  Anxiolytics/Hypnotics Failed - 08/01/2020  1:11 PM      Failed - This refill cannot be delegated      Failed - Urine Drug Screen completed in last 360 days.      Passed - Valid encounter within last 6 months    Recent Outpatient Visits           4 months ago Chronic pain of both shoulders   [facility] [place], [name], DO   5 months ago Chronic gout of right foot, unspecified cause   [facility] [place], [name], DO   9 months ago Acute gout of left ankle, unspecified cause   [facility] Smitty Cords, DO   11 months ago Annual physical exam   [facility] Smitty Cords, DO   1 year ago Generalized anxiety disorder with panic attacks   [facility] [place], [name], DO       Future Appointments             In 2 days Althea Charon, [name], DO [facility], [facility]             Refused Prescriptions Disp Refills   allopurinol (ZYLOPRIM) 100 MG tablet 180 tablet     Sig: TAKE 2 TABLETS(200 MG) BY MOUTH DAILY      Endocrinology:  Gout Agents Passed - 08/01/2020  1:11 PM      Passed - Uric Acid in normal range and within 360 days    Uric Acid, Serum  Date Value Ref Range Status  07/27/2020 6.1 4.0 - 8.0 mg/dL Final    Comment:    Therapeutic target for gout patients: <6.0 mg/dL .           Passed - Cr in normal range and within 360 days    Creat  Date Value Ref Range Status  07/27/2020  0.92 0.70 - 1.25 mg/dL Final    Comment:    For patients >85 years of age, the reference limit for Creatinine is approximately 13% higher for people identified as African-American. Verna Czech - Valid encounter within last 12 months    Recent Outpatient Visits           4 months ago Chronic pain of both shoulders   [facility] [place], [name], DO   5 months ago Chronic gout of right foot, unspecified cause   [facility] [place], [name], DO   9 months ago Acute gout of left ankle, unspecified cause   [facility] Smitty Cords, DO   11 months ago Annual physical exam   [facility] Smitty Cords, DO   1 year ago Generalized anxiety disorder with panic attacks   [facility] Althea Charon, [name], DO       Future Appointments  In 2 days Althea Charon, [name], DO [facility], [facility]

## 2020-08-03 ENCOUNTER — Other Ambulatory Visit: Payer: Self-pay

## 2020-08-03 ENCOUNTER — Other Ambulatory Visit: Payer: Self-pay | Admitting: Family Medicine

## 2020-08-03 ENCOUNTER — Ambulatory Visit (INDEPENDENT_AMBULATORY_CARE_PROVIDER_SITE_OTHER): Payer: 59 | Admitting: Family Medicine

## 2020-08-03 ENCOUNTER — Encounter: Payer: Self-pay | Admitting: Family Medicine

## 2020-08-03 VITALS — BP 136/80 | HR 69 | Temp 97.5°F | Resp 16 | Ht 70.0 in | Wt 204.6 lb

## 2020-08-03 DIAGNOSIS — E782 Mixed hyperlipidemia: Secondary | ICD-10-CM

## 2020-08-03 DIAGNOSIS — E039 Hypothyroidism, unspecified: Secondary | ICD-10-CM

## 2020-08-03 DIAGNOSIS — M1A0710 Idiopathic chronic gout, right ankle and foot, without tophus (tophi): Secondary | ICD-10-CM

## 2020-08-03 DIAGNOSIS — J432 Centrilobular emphysema: Secondary | ICD-10-CM

## 2020-08-03 DIAGNOSIS — Z720 Tobacco use: Secondary | ICD-10-CM

## 2020-08-03 DIAGNOSIS — Z0000 Encounter for general adult medical examination without abnormal findings: Secondary | ICD-10-CM | POA: Diagnosis not present

## 2020-08-03 DIAGNOSIS — F411 Generalized anxiety disorder: Secondary | ICD-10-CM

## 2020-08-03 DIAGNOSIS — Z23 Encounter for immunization: Secondary | ICD-10-CM | POA: Diagnosis not present

## 2020-08-03 DIAGNOSIS — F410 Panic disorder [episodic paroxysmal anxiety] without agoraphobia: Secondary | ICD-10-CM

## 2020-08-03 MED ORDER — ALBUTEROL SULFATE HFA 108 (90 BASE) MCG/ACT IN AERS: 1.0000 | INHALATION_SPRAY | Freq: Four times a day (QID) | RESPIRATORY_TRACT | 2 refills | Status: DC | PRN

## 2020-08-03 MED ORDER — ALLOPURINOL 100 MG PO TABS: 200.0000 mg | ORAL_TABLET | Freq: Every day | ORAL | 3 refills | Status: DC

## 2020-08-03 NOTE — Progress Notes (Signed)
Subjective:    Patient ID: Vincent Dunn, male    DOB: 1961/05/09, 59 y.o.   MRN: [id number]  Vincent Dunn is a 58 y.o. male presenting on 08/03/2020 for Annual Exam   HPI   Here for Annual Physical and Lab Review.  Wellness / Lifestyle Overweight BMI >29 Weight gain 8 lbs 4 month He is improving diet and goal to improve regular exercise Sleeping better now see below  Chronic Generalized Anxiety with panic attacks See last note for background. Continues on chronic Klonopin 0.75mg  BID with good results for his insomnia, had been on for >30 years No recent acute panic attacks. Failed SSRI in past lexapro, sertraline  Hypothyroidism Improved thyroid lab normal TSH Free T4 Taking levothyroxine Doing well  HYPERLIPIDEMIA: - Reports no concerns. Last lipid panel 07/2020, showed TG 246, LDL 109, low HDL Not on statin. In past he took one statin before but only temporary   Additional problems  Elevated Hemoglobin - Prior trend up from 14-15 now up to 16-17 on Hgb over past few years Lab recently showed improvement to 16.3 Active smoker Denies symptoms  Chronic Gout Last visit 03/2020, dose increase Allopurinol 100 to  daily, taking 2 pills instead of 1, and doing well no gout flares. - Last lab Uric Acid down to 6.1 (previously 9.8, 01/2020) - He is doing well and wants to keep dose Has colchicine PRN flare if need  Tobacco Abuse / Centrilobular Emphysema Prior history smoking 1ppd in past Trying to quit smoking, reduce and chip away, < 0.5ppd now reduced more. Not quite ready to fully quit No prior dx COPD but endorsing dyspnea on exertion now   Additional complaint  Left foot plantar fasciitis - using compression sock   Health Maintenance:  Due for Flu Shot, will receive today   UTD Moderna vaccine, will get [place] a copy of the card.  Prostate CA Screening: Last PSA 0.62 (07/2020) last result 0.6. Currently asymptomatic. No known family  history of prostate CA.   [facility] Dr Norma Fredrickson -03/05/18 [facility] Dr Norma Fredrickson- Colonoscopy - diverticulitis, lymphocitic, no polyp, return 10 years. Next due 2029.    Depression screen [facility] 2/9 08/03/2020 04/01/2020 02/10/2020  Decreased Interest 0 0 0  Down, Depressed, Hopeless 0 0 3  PHQ - 2 Score 0 0 3  Altered sleeping - - 0  Tired, decreased energy - - 0  Change in appetite - - 0  Feeling bad or failure about yourself  - - 0  Trouble concentrating - - 0  Moving slowly or fidgety/restless - - 0  Suicidal thoughts - - 0  PHQ-9 Score - - 3  Difficult doing work/chores - - Not difficult at all    Past Medical History:  Diagnosis Date  . Anxiety   . Thyroid disease    No past surgical history on file. Social History   Socioeconomic History  . Marital status: Married    Spouse name: Not on file  . Number of children: Not on file  . Years of education: [employer]  . Highest education level: High school graduate  Occupational History  . Not on file  Tobacco Use  . Smoking status: Current Every Day Smoker    Packs/day: 0.50    Years: 40.00    Pack years: 20.00    Types: Cigarettes  . Smokeless tobacco: Never Used  . Tobacco comment: previous up to 1ppd  Substance and Sexual Activity  . Alcohol use: Yes  Alcohol/week: 10.0 standard drinks    Types: 10 Standard drinks or equivalent per week  . Drug use: Never  . Sexual activity: Not on file  Other Topics Concern  . Not on file  Social History Narrative  . Not on file   Social Determinants of Health   Financial Resource Strain:   . Difficulty of Paying Living Expenses: Not on file  Food Insecurity:   . Worried About Programme researcher, broadcasting/film/videounning Out of Food in the Last Year: Not on file  . Ran Out of Food in the Last Year: Not on file  Transportation Needs:   . Lack of Transportation (Medical): Not on file  . Lack of Transportation (Non-Medical): Not on file  Physical Activity:   . Days of Exercise per Week: Not on file  . Minutes  of Exercise per Session: Not on file  Stress:   . Feeling of Stress : Not on file  Social Connections:   . Frequency of Communication with Friends and Family: Not on file  . Frequency of Social Gatherings with Friends and Family: Not on file  . Attends Religious Services: Not on file  . Active Member of Clubs or Organizations: Not on file  . Attends BankerClub or Organization Meetings: Not on file  . Marital Status: Not on file  Intimate Partner Violence:   . Fear of Current or Ex-Partner: Not on file  . Emotionally Abused: Not on file  . Physically Abused: Not on file  . Sexually Abused: Not on file   Family History  Problem Relation Age of Onset  . Cancer Mother        lung, brain  . Heart disease Father   . Dementia Sister    Current Outpatient Medications on File Prior to Visit  Medication Sig  . clonazePAM (KLONOPIN) 0.5 MG tablet Take 1.5 tablets (0.75 mg total) by mouth 2 (two) times daily.  . colchicine 0.6 MG tablet Take 1 tablet (0.6 mg total) by mouth daily as needed (gout flare). May repeat dose for 1 pill 2 hours later if need, then can continue daily for up to 3-5 days as needed.  . cyclobenzaprine (FLEXERIL) 10 MG tablet Take 1 tablet (10 mg total) by mouth at bedtime.  Marland Kitchen. levothyroxine (SYNTHROID) 200 MCG tablet TAKE 1 TABLET(200 MCG) BY MOUTH DAILY BEFORE BREAKFAST   No current facility-administered medications on file prior to visit.    Review of Systems  Constitutional: Negative for activity change, appetite change, chills, diaphoresis, fatigue and fever.  HENT: Negative for congestion and hearing loss.   Eyes: Negative for visual disturbance.  Respiratory: Negative for apnea, cough, chest tightness, shortness of breath and wheezing.   Cardiovascular: Negative for chest pain, palpitations and leg swelling.  Gastrointestinal: Negative for abdominal pain, constipation, diarrhea, nausea and vomiting.  Endocrine: Negative for cold intolerance.  Genitourinary: Negative  for difficulty urinating, dysuria, frequency and hematuria.  Musculoskeletal: Negative for arthralgias and neck pain.  Skin: Negative for rash.  Allergic/Immunologic: Negative for environmental allergies.  Neurological: Negative for dizziness, weakness, light-headedness, numbness and headaches.  Hematological: Negative for adenopathy.  Psychiatric/Behavioral: Negative for behavioral problems, dysphoric mood and sleep disturbance.   Per HPI unless specifically indicated above      Objective:    BP 136/80 (BP Location: Left Arm, Cuff Size: Normal)   Pulse 69   Temp (!) 97.5 F (36.4 C) (Temporal)   Resp 16   Ht 5\' 10"  (1.778 m)   Wt 204 lb 9.6 oz (92.8 kg)  SpO2 96%   BMI 29.36 kg/m   Wt Readings from Last 3 Encounters:  08/03/20 204 lb 9.6 oz (92.8 kg)  04/01/20 196 lb 9.6 oz (89.2 kg)  02/10/20 198 lb (89.8 kg)    Physical Exam Vitals and nursing note reviewed.  Constitutional:      General: He is not in acute distress.    Appearance: He is well-developed. He is not diaphoretic.     Comments: Well-appearing, comfortable, cooperative  HENT:     Head: Normocephalic and atraumatic.  Eyes:     General:        Right eye: No discharge.        Left eye: No discharge.     Conjunctiva/sclera: Conjunctivae normal.     Pupils: Pupils are equal, round, and reactive to light.  Neck:     Thyroid: No thyromegaly.  Cardiovascular:     Rate and Rhythm: Normal rate and regular rhythm.     Heart sounds: Normal heart sounds. No murmur heard.   Pulmonary:     Effort: Pulmonary effort is normal. No respiratory distress.     Breath sounds: Normal breath sounds. No wheezing or rales.  Abdominal:     General: Bowel sounds are normal. There is no distension.     Palpations: Abdomen is soft. There is no mass.     Tenderness: There is no abdominal tenderness.  Musculoskeletal:        General: No tenderness. Normal range of motion.     Cervical back: Normal range of motion and neck  supple.     Comments: Upper / Lower Extremities: - Normal muscle tone, strength bilateral upper extremities 5/5, lower extremities 5/5  Lymphadenopathy:     Cervical: No cervical adenopathy.  Skin:    General: Skin is warm and dry.     Findings: No erythema or rash.  Neurological:     Mental Status: He is alert and oriented to person, place, and time.     Comments: Distal sensation intact to light touch all extremities  Psychiatric:        Behavior: Behavior normal.     Comments: Well groomed, good eye contact, normal speech and thoughts       Results for orders placed or performed in visit on 07/27/20  Uric acid  Result Value Ref Range   Uric Acid, Serum 6.1 4.0 - 8.0 mg/dL  T4, free  Result Value Ref Range   Free T4 1.6 0.8 - 1.8 ng/dL  TSH  Result Value Ref Range   TSH 0.63 0.40 - 4.50 mIU/L  PSA  Result Value Ref Range   PSA 0.62 < OR = 4.0 ng/mL  Lipid panel  Result Value Ref Range   Cholesterol 185 <200 mg/dL   HDL 39 (L) > OR = 40 mg/dL   Triglycerides 810 (H) <150 mg/dL   LDL Cholesterol (Calc) 109 (H) mg/dL (calc)   Total CHOL/HDL Ratio 4.7 <5.0 (calc)   Non-HDL Cholesterol (Calc) 146 (H) <130 mg/dL (calc)  COMPLETE METABOLIC PANEL WITH GFR  Result Value Ref Range   Glucose, Bld 92 65 - 99 mg/dL   BUN 8 7 - 25 mg/dL   Creat 1.75 1.02 - 5.85 mg/dL   GFR, Est Non African American 89 > OR = 60 mL/min/1.82m2   GFR, Est African American 104 > OR = 60 mL/min/1.52m2   BUN/Creatinine Ratio NOT APPLICABLE 6 - 22 (calc)   Sodium 137 135 - 146 mmol/L   Potassium 4.1  3.5 - 5.3 mmol/L   Chloride 106 98 - 110 mmol/L   CO2 22 20 - 32 mmol/L   Calcium 8.8 8.6 - 10.3 mg/dL   Total Protein 6.3 6.1 - 8.1 g/dL   Albumin 4.2 3.6 - 5.1 g/dL   Globulin 2.1 1.9 - 3.7 g/dL (calc)   AG Ratio 2.0 1.0 - 2.5 (calc)   Total Bilirubin 0.5 0.2 - 1.2 mg/dL   Alkaline phosphatase (APISO) 86 35 - 144 U/L   AST 29 10 - 35 U/L   ALT 41 9 - 46 U/L  CBC with Differential/Platelet    Result Value Ref Range   WBC 7.7 3.8 - 10.8 Thousand/uL   RBC 5.03 4.20 - 5.80 Million/uL   Hemoglobin 16.3 13.2 - 17.1 g/dL   HCT 84.1 38 - 50 %   MCV 95.4 80.0 - 100.0 fL   MCH 32.4 27.0 - 33.0 pg   MCHC 34.0 32.0 - 36.0 g/dL   RDW 66.0 63.0 - 16.0 %   Platelets 256 140 - 400 Thousand/uL   MPV 10.5 7.5 - 12.5 fL   Neutro Abs 5,198 1,500 - 7,800 cells/uL   Lymphs Abs 1,609 850 - 3,900 cells/uL   Absolute Monocytes 631 200 - 950 cells/uL   Eosinophils Absolute 193 15.0 - 500.0 cells/uL   Basophils Absolute 69 0.0 - 200.0 cells/uL   Neutrophils Relative % 67.5 %   Total Lymphocyte 20.9 %   Monocytes Relative 8.2 %   Eosinophils Relative 2.5 %   Basophils Relative 0.9 %  Hemoglobin A1c  Result Value Ref Range   Hgb A1c MFr Bld 5.4 <5.7 % of total Hgb   Mean Plasma Glucose 108 (calc)   eAG (mmol/L) 6.0 (calc)      Assessment & Plan:   Problem List Items Addressed This Visit    Mixed hyperlipidemia    Elevated LDL, TG, low HDL Last lipid panel 07/2020 The 10-year ASCVD risk score Denman George DC Jr., et al., 2013) is: 17.5%  Plan: 1. Discussion on statin therapy and risk benefit ASCVD reduction - we agree to trial lifestyle diet regimen first, repeat lab in 6 months - Defer statin at this time 2. Encourage improved lifestyle - low carb/cholesterol, reduce portion size, continue improving regular exercise      Generalized anxiety disorder with panic attacks   Chronic gout    Significantly improved chronic recurrent gout of bilateral feet/ankles No actual flare in past >4-6+ months since started allopurinol prophylaxis Only mild early symptoms, resolve with colchicine occasionally Last Uric Acid down to 6.1 (07/2020) with higher dose allopurinol, prior 9.8 (01/2020)  Plan: 1. CONTINUE Allopurinol 200mg  daily prophylaxis, refilled 2. Continue Colchicine 0.6mg  PRN early flare. Can repeat dose in 2 hours on day 1, then can take daily PRN for 1-5 days approx Avoid food triggers  (red meat, alcohol) - goal to limit triggers to avoid adding prophylaxis medication in future, discussed can reconsider if interested Follow-up as need if not improve      Relevant Medications   allopurinol (ZYLOPRIM) 100 MG tablet   Centrilobular emphysema (HCC)    See A&P below Likely new diagnosis based on symptoms, exam and smoking history LDCT ordered      Relevant Medications   albuterol (VENTOLIN HFA) 108 (90 Base) MCG/ACT inhaler   Acquired hypothyroidism    Normalized TSH thyroid panel now with good results Clinically controlled, chronic hypothyroidism Continue current dose Levothyroxine daily Check thyroid lab again in 6 months  Other Visit Diagnoses    Annual physical exam    -  Primary   Needs flu shot       Relevant Orders   Flu Vaccine QUAD 36+ mos IM (Completed)   Tobacco abuse          Updated Health Maintenance information - Flu shot today - He will bring copy of COVID card to update Moderna Reviewed recent lab results with patient Encouraged improvement to lifestyle with diet and exercise - Goal of weight loss  Sent message to Glenna Fellows RN, [facility] for Low Dose CT Chest for Lung CA Screening and evaluation of lungs with new diagnosis of COPD / Emphysema based on clinical evaluation today. - Given sample Trelegy inhaler, 1 puff daily for 2 week sample, we can re order if patient is interested and if it benefits him - Rx Albuterol sent PRN rescue only Smoking cessation  Meds ordered this encounter  Medications  . allopurinol (ZYLOPRIM) 100 MG tablet    Sig: Take 2 tablets (200 mg total) by mouth daily.    Dispense:  180 tablet    Refill:  3    Extra refills added for future  . albuterol (VENTOLIN HFA) 108 (90 Base) MCG/ACT inhaler    Sig: Inhale 1-2 puffs into the lungs every 6 (six) hours as needed for wheezing or shortness of breath (cough).    Dispense:  1 each    Refill:  2      Follow up plan: Return in about 6 weeks  (around 09/14/2020) for 4-6 week follow up COPD if need then next f/u will be 6 month fasting lab then F/u HLD Thyroid labs.   Future 6 month lab Lipid, Thyroid  Saralyn Pilar, DO [facility] 08/03/2020, 9:04 AM

## 2020-08-03 NOTE — Assessment & Plan Note (Signed)
See A&P below Likely new diagnosis based on symptoms, exam and smoking history LDCT ordered

## 2020-08-03 NOTE — Assessment & Plan Note (Addendum)
Normalized TSH thyroid panel now with good results Clinically controlled, chronic hypothyroidism Continue current dose Levothyroxine daily Check thyroid lab again in 6 months

## 2020-08-03 NOTE — Patient Instructions (Addendum)
Thank you for coming to the office today.  1. Chemistry - Normal results, including electrolytes, kidney and liver function. Normal fasting blood sugar   2. Hemoglobin A1c (Diabetes screening) - 5.4, normal not in range of Pre-Diabetes (>5.7 to 6.4)   3. PSA Prostate Cancer Screening - 0.62, negative.  4. TSH Thyroid Function Tests - Controlled on Levothyroxine  5. Cholesterol - Elevated TG 246 mild elevated LDL 109 The 10-year ASCVD risk score Denman George DC Jr., et al., 2013) is: 18.5%  6. CBC Blood Counts - Normal, no anemia, other abnormality  7. Uric Acid 6.1 improved from 9.8  ------------------------  You are at increased risk of future Cardiovascular complications such as Heart Attack or Stroke from an artery blockage due to abnormal cholesterol and/or risk factors. - As discussed, Statin Cholesterol pills both can both LOWER cholesterol and REDUCE this future risk of heart attack and stroke Think about this option, we can do med like Rosuvastatin, Atorvastatin etc  --------------------------  Likely have COPD vs emphysema try new inhaler as prescribed every day  Albuterol rescue inhaler as needed only  Try to quit smoking.  Stay tuned for call from [facility] for low dose lung CT  Please schedule a Follow-up Appointment to: Return in about 6 weeks (around 09/14/2020) for 4-6 week follow up COPD if need then next f/u will be 6 month fasting lab then F/u HLD Thyroid labs.  If you have any other questions or concerns, please feel free to call the office or send a message through MyChart. You may also schedule an earlier appointment if necessary.  Additionally, you may be receiving a survey about your experience at our office within a few days to 1 week by e-mail or mail. We value your feedback.  Saralyn Pilar, DO [facility], [facility]              Plantar Fascia Stretches / Exercises  See other page with pictures of each  exercise.  Start with 1 or 2 of these exercises that you are most comfortable with. Do not do any exercises that cause you significant worsening pain. Some of these may cause some "stretching soreness" but it should go away after you stop the exercise, and get better over time. Gradually increase up to 3-4 exercises as tolerated.  You may begin exercising the muscles of your foot right away by gently stretching them as follows:  Stretching: Towel stretch: Sit on a hard surface with your injured leg stretched out in front of you. Loop a towel around the ball of your foot and pull the towel toward your body keeping your knee straight. Hold this position for 15 to 30 seconds then relax. Repeat 3 times. When the towel stretch becomes to easy, you may begin doing the standing calf stretch.  Standing calf stretch: Facing a wall, put your hands against the wall at about eye level. Keep the injured leg back, the uninjured leg forward, and the heel of your injured leg on the floor. Turn your injured foot slightly inward (as if you were pigeon-toed) as you slowly lean into the wall until you feel a stretch in the back of your calf. Hold for 15 to 30 seconds. Repeat 3 times. Do this exercise several times each day. When you can stand comfortably on your injured foot, you can begin stretching the bottom of your foot using the plantar fascia stretch.  Plantar fascia stretch: Stand with the ball of your injured foot  on a stair. Reach for the bottom step with your heel until you feel a stretch in the arch of your foot. Hold this position for 15 to 30 seconds and then relax. Repeat 3 times. After you have stretched the bottom muscles of your foot, you can begin strengthening the top muscles of your foot.  Frozen can roll: Roll your bare injured foot back and forth from your heel to your mid-arch over a frozen juice can. Repeat for 3 to 5 minutes. This exercise is particularly helpful if done first thing in the  morning. Towel pickup: With your heel on the ground, pick up a towel with your toes. Release. Repeat 10 to 20 times. When this gets easy, add more resistance by placing a book or small weight on the towel. Static and dynamic balance exercises Place a chair next to your non-injured leg and stand upright. (This will provide you with balance if needed.) Stand on your injured foot. Try to raise the arch of your foot while keeping your toes on the floor. Try to maintain this position and balance on your injured side for 30 seconds. This exercise can be made more difficult by doing it on a piece of foam or a pillow, or with your eyes closed. Stand in the same position as above. Keep your foot in this position and reach forward in front of you with your injured side's hand, allowing your knee to bend. Repeat this 10 times while maintaining the arch height. This exercise can be made more difficult by reaching farther in front of you. Do 2 sets. Stand in the same position as above. While maintaining your arch height, reach the injured side's hand across your body toward the chair. The farther you reach, the more challenging the exercise. Do 2 sets of 10.  Next, you can begin strengthening the muscles of your foot and lower leg by using elastic tubing.  Strengthening: Resisted dorsiflexion: Sit with your injured leg out straight and your foot facing a doorway. Tie a loop in one end of the tubing. Put your foot through the loop so that the tubing goes around the arch of your foot. Tie a knot in the other end of the tubing and shut the knot in the door. Move backward until there is tension in the tubing. Keeping your knee straight, pull your foot toward your body, stretching the tubing. Slowly return to the starting position. Do 3 sets of 10. Resisted plantar flexion: Sit with your leg outstretched and loop the middle section of the tubing around the ball of your foot. Hold the ends of the tubing in both hands.  Gently press the ball of your foot down and point your toes, stretching the tubing. Return to the starting position. Do 3 sets of 10. Resisted inversion: Sit with your legs out straight and cross your uninjured leg over your injured ankle. Wrap the tubing around the ball of your injured foot and then loop it around your uninjured foot so that the tubing is anchored there at one end. Hold the other end of the tubing in your hand. Turn your injured foot inward and upward. This will stretch the tubing. Return to the starting position. Do 3 sets of 10. Resisted eversion: Sit with both legs stretched out in front of you, with your feet about a shoulder's width apart. Tie a loop in one end of the tubing. Put your injured foot through the loop so that the tubing goes around the arch of  that foot and wraps around the outside of the uninjured foot. Hold onto the other end of the tubing with your hand to provide tension. Turn your injured foot up and out. Make sure you keep your uninjured foot still so that it will allow the tubing to stretch as you move your injured foot. Return to the starting position. Do 3 sets of 10.

## 2020-08-03 NOTE — Assessment & Plan Note (Addendum)
Elevated LDL, TG, low HDL Last lipid panel 07/2020 The 10-year ASCVD risk score Denman George DC Jr., et al., 2013) is: 17.5%  Plan: 1. Discussion on statin therapy and risk benefit ASCVD reduction - we agree to trial lifestyle diet regimen first, repeat lab in 6 months - Defer statin at this time 2. Encourage improved lifestyle - low carb/cholesterol, reduce portion size, continue improving regular exercise

## 2020-08-03 NOTE — Assessment & Plan Note (Signed)
Significantly improved chronic recurrent gout of bilateral feet/ankles No actual flare in past >4-6+ months since started allopurinol prophylaxis Only mild early symptoms, resolve with colchicine occasionally Last Uric Acid down to 6.1 (07/2020) with higher dose allopurinol, prior 9.8 (01/2020)  Plan: 1. CONTINUE Allopurinol 200mg  daily prophylaxis, refilled 2. Continue Colchicine 0.6mg  PRN early flare. Can repeat dose in 2 hours on day 1, then can take daily PRN for 1-5 days approx Avoid food triggers (red meat, alcohol) - goal to limit triggers to avoid adding prophylaxis medication in future, discussed can reconsider if interested Follow-up as need if not improve

## 2020-08-08 ENCOUNTER — Telehealth: Payer: Self-pay

## 2020-08-08 DIAGNOSIS — Z122 Encounter for screening for malignant neoplasm of respiratory organs: Secondary | ICD-10-CM

## 2020-08-08 DIAGNOSIS — Z87891 Personal history of nicotine dependence: Secondary | ICD-10-CM

## 2020-08-08 NOTE — Telephone Encounter (Signed)
Contacted patient for lung CT screening program based on referral from Dr. Althea Charon.  Message left for patient to call Glenna Fellows, lung navigator to schedule CT scan.

## 2020-08-10 NOTE — Telephone Encounter (Signed)
Received referral for initial lung cancer screening scan. Contacted patient and obtained smoking history,(current, 45 pack year) as well as answering questions related to screening process. Patient denies signs of lung cancer such as weight loss or hemoptysis. Patient denies comorbidity that would prevent curative treatment if lung cancer were found. Patient is scheduled for shared decision making visit and CT scan on 08/17/20.

## 2020-08-10 NOTE — Addendum Note (Signed)
Addended by: Jonne Ply on: 08/10/2020 02:59 PM   Modules accepted: Orders

## 2020-08-17 ENCOUNTER — Inpatient Hospital Stay: Payer: 59 | Attending: Oncology | Admitting: Oncology

## 2020-08-17 ENCOUNTER — Encounter: Payer: Self-pay | Admitting: *Deleted

## 2020-08-17 ENCOUNTER — Other Ambulatory Visit: Payer: Self-pay

## 2020-08-17 ENCOUNTER — Ambulatory Visit
Admission: RE | Admit: 2020-08-17 | Discharge: 2020-08-17 | Disposition: A | Discharge status: Home / Self Care | Payer: 59 | Source: Ambulatory Visit | Attending: Oncology | Admitting: Oncology

## 2020-08-17 DIAGNOSIS — Z122 Encounter for screening for malignant neoplasm of respiratory organs: Secondary | ICD-10-CM | POA: Insufficient documentation

## 2020-08-17 DIAGNOSIS — Z87891 Personal history of nicotine dependence: Secondary | ICD-10-CM | POA: Insufficient documentation

## 2020-08-17 NOTE — Progress Notes (Signed)
Virtual Visit via Video Note  I connected with Vincent Dunn on 08/17/20 at  4:00 PM EST by a video enabled telemedicine application and verified that I am speaking with the correct person using two identifiers.  Location: Patient: Home Provider: Clinic    I discussed the limitations of evaluation and management by telemedicine and the availability of in person appointments. The patient expressed understanding and agreed to proceed.  I discussed the assessment and treatment plan with the patient. The patient was provided an opportunity to ask questions and all were answered. The patient agreed with the plan and demonstrated an understanding of the instructions.   The patient was advised to call back or seek an in-person evaluation if the symptoms worsen or if the condition fails to improve as anticipated.   In accordance with CMS guidelines, patient has met eligibility criteria including age, absence of signs or symptoms of lung cancer.  Social History   Tobacco Use  . Smoking status: Current Every Day Smoker    Packs/day: 1.00    Years: 45.00    Pack years: 45.00    Types: Cigarettes  . Smokeless tobacco: Never Used  . Tobacco comment: previous up to 1ppd  Substance Use Topics  . Alcohol use: Yes    Alcohol/week: 10.0 standard drinks    Types: 10 Standard drinks or equivalent per week  . Drug use: Never      A shared decision-making session was conducted prior to the performance of CT scan. This includes one or more decision aids, includes benefits and harms of screening, follow-up diagnostic testing, over-diagnosis, false positive rate, and total radiation exposure.   Counseling on the importance of adherence to annual lung cancer LDCT screening, impact of co-morbidities, and ability or willingness to undergo diagnosis and treatment is imperative for compliance of the program.   Counseling on the importance of continued smoking cessation for former smokers; the importance of smoking  cessation for current smokers, and information about tobacco cessation interventions have been given to patient including [place] [employer] and [employer] [place] programs.   Written order for lung cancer screening with LDCT has been given to the patient and any and all questions have been answered to the best of my abilities.    Yearly follow up will be coordinated by Burgess Estelle, Thoracic Navigator.  I provided 15 minutes of face-to-face video visit time during this encounter, and > 50% was spent counseling as documented under my assessment & plan.   Jacquelin Hawking, NP

## 2020-08-19 ENCOUNTER — Telehealth: Payer: Self-pay

## 2020-08-19 ENCOUNTER — Telehealth: Payer: Self-pay | Admitting: *Deleted

## 2020-08-19 DIAGNOSIS — I712 Thoracic aortic aneurysm, without rupture, unspecified: Secondary | ICD-10-CM | POA: Insufficient documentation

## 2020-08-19 DIAGNOSIS — I7121 Aneurysm of the ascending aorta, without rupture: Secondary | ICD-10-CM

## 2020-08-19 NOTE — Telephone Encounter (Signed)
Patient called to request the results of his screening lung CT from yesterday. Please advise.   Thank you.  Vincent Dunn   Copied from CRM [id number]. Topic: General - Other >> Aug 18, 2020  4:31 PM Marylen Ponto wrote: Reason for CRM: Pt stated he was told that his cancer screening CT results would be sent to his pcp. Pt requests call back to go over the results. CB# [phone] Ext. [phone]

## 2020-08-19 NOTE — Telephone Encounter (Signed)
We did receive results from his CT scan screening test.  He was notified by Glenna Fellows RN from the CT screening program. Patient should now be aware of his results as of today Fri 11/12.  He would be due for yearly CT scan screening.  Additionally a thoracic aortic aneurysm 4 cm was identified, this was recommended to be followed by a Cardiothoracic Surgeon specialist and repeat imaging in 6 months.  Would you be able to call patient to confirm he received these results and let him know that I can place referral to this specialist if he is interested - the Cardiothoracic Surgeons with [facility] are in [place].  They can see him next and order his next imaging test in 6 months.  Or if he wants to discuss this we can see him in April at his next apt and then discuss referral  3. 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 Recommend semi-annual imaging followup by CTA or MRA and referral to cardiothoracic surgery if not already obtained. This recommendation follows 2010 ACCF/AHA/AATS/ACR/ASA/SCA/SCAI/SIR/STS/SVM Guidelines for the Diagnosis and Management of Patients With Thoracic Aortic Disease. Circulation. 2010; 121: X507-K25. Aortic aneurysm NOS (ICD10-I71.9)  Saralyn Pilar, DO [facility] 08/19/2020, 10:19 AM

## 2020-08-19 NOTE — Telephone Encounter (Signed)
Pt given lab results per notes of Dr. Althea Charon on 08/19/20. Pt verbalized understanding. Patient is aware he will be followed by a  cardiothoracic surgeon and will repeat imaging . Care advise given to call back for questions or concerns.

## 2020-08-19 NOTE — Telephone Encounter (Signed)
Notified patient of LDCT lung cancer screening program results with recommendation for 12 month follow up imaging. Also notified of incidental findings noted below and is encouraged to discuss further with PCP who will receive a copy of this note and/or the CT report. Discussed aortic aneurysm in detail including recommended follow up. Patient verbalizes understanding.   IMPRESSION: 1. Lung-RADS 2, benign appearance or behavior. Continue annual screening with low-dose chest CT without contrast in 12 months. 2. Diffuse bronchial wall thickening with emphysema, as above; imaging findings suggestive of underlying COPD. 3. 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 Recommend semi-annual imaging followup by CTA or MRA and referral to cardiothoracic surgery if not already obtained. This recommendation follows 2010 ACCF/AHA/AATS/ACR/ASA/SCA/SCAI/SIR/STS/SVM Guidelines for the Diagnosis and Management of Patients With Thoracic Aortic Disease. Circulation. 2010; 121: O156-F53. Aortic aneurysm NOS (ICD10-I71.9) 4. Coronary artery calcifications. 5. Gallstones.  Aortic Atherosclerosis (ICD10-I70.0) and Emphysema (ICD10-J43.9).

## 2020-08-19 NOTE — Telephone Encounter (Signed)
unable to reach patient please triage with provider's recommendation. ° °

## 2020-08-23 ENCOUNTER — Telehealth: Payer: Self-pay | Admitting: Family Medicine

## 2020-08-23 NOTE — Telephone Encounter (Signed)
Cardiothoracic Surgery (Order [id number])  Pt has called in and states he was specifically told to call back if had not heard anything from surgeon by today, pt has not heard back and wants office to reach back out. His best# for contact is [phone]

## 2020-08-23 NOTE — Telephone Encounter (Signed)
Please notify patient  It looks like his appointment was already scheduled with Cardiothoracic surgery.  It is listed as 09/19/20 at 12:00pm with Dr Vickey Sages  Saralyn Pilar, DO [facility] 08/23/2020, 6:31 PM

## 2020-08-24 NOTE — Telephone Encounter (Signed)
Left detail message. 

## 2020-08-24 NOTE — Telephone Encounter (Signed)
Patient called and given appointment below, he says he received the message.

## 2020-09-19 ENCOUNTER — Other Ambulatory Visit: Payer: Self-pay

## 2020-09-19 ENCOUNTER — Encounter: Payer: Self-pay | Admitting: Cardiothoracic Surgery

## 2020-09-19 ENCOUNTER — Institutional Professional Consult (permissible substitution): Payer: 59 | Admitting: Cardiothoracic Surgery

## 2020-09-19 VITALS — BP 156/91 | HR 71 | Resp 18 | Ht 70.0 in | Wt 201.2 lb

## 2020-09-19 DIAGNOSIS — I712 Thoracic aortic aneurysm, without rupture, unspecified: Secondary | ICD-10-CM

## 2020-09-26 ENCOUNTER — Other Ambulatory Visit: Payer: Self-pay | Admitting: Family Medicine

## 2020-09-26 DIAGNOSIS — M1A0710 Idiopathic chronic gout, right ankle and foot, without tophus (tophi): Secondary | ICD-10-CM

## 2020-10-10 ENCOUNTER — Other Ambulatory Visit: Payer: Self-pay | Admitting: Family Medicine

## 2020-10-10 DIAGNOSIS — M1A0710 Idiopathic chronic gout, right ankle and foot, without tophus (tophi): Secondary | ICD-10-CM

## 2020-10-26 ENCOUNTER — Other Ambulatory Visit: Payer: Self-pay | Admitting: Family Medicine

## 2020-10-26 DIAGNOSIS — F410 Panic disorder [episodic paroxysmal anxiety] without agoraphobia: Secondary | ICD-10-CM

## 2020-10-26 DIAGNOSIS — F411 Generalized anxiety disorder: Secondary | ICD-10-CM

## 2020-10-26 NOTE — Telephone Encounter (Signed)
Copied from CRM [id number]. Topic: Quick Communication - Rx Refill/Question >> Oct 26, 2020 12:45 PM Adrian Prince D wrote: Medication: clonazepam  Has the patient contacted their pharmacy? No. (Agent: If no, request that the patient contact the pharmacy for the refill.) (Agent: If yes, when and what did the pharmacy advise?)  Preferred Pharmacy (with phone number or street name): [id number] [place], [place] - [address] AT [facility]  Agent: Please be advised that RX refills may take up to 3 business days. We ask that you follow-up with your pharmacy.

## 2020-10-26 NOTE — Telephone Encounter (Signed)
Requested medication (s) are due for refill today - no- soon  Requested medication (s) are on the active medication list -yes  Future visit scheduled -yes  Last refill: 08/01/20 #90 2 RF  Notes to clinic: Request non delegated rx  Requested Prescriptions  Pending Prescriptions Disp Refills   clonazePAM (KLONOPIN) 0.5 MG tablet 90 tablet 2    Sig: Take 1.5 tablets (0.75 mg total) by mouth 2 (two) times daily.      Not Delegated - Psychiatry:  Anxiolytics/Hypnotics Failed - 10/26/2020  3:16 PM      Failed - This refill cannot be delegated      Failed - Urine Drug Screen completed in last 360 days      Passed - Valid encounter within last 6 months    Recent Outpatient Visits           2 months ago Annual physical exam   [facility], [name], DO   6 months ago Chronic pain of both shoulders   [facility], [name], DO   8 months ago Chronic gout of right foot, unspecified cause   [facility] Smitty Cords, DO   11 months ago Acute gout of left ankle, unspecified cause   [facility] Smitty Cords, DO   1 year ago Annual physical exam   [facility] Smitty Cords, DO       Future Appointments             In 3 months Althea Charon, [name], DO [facility], [facility]                 Requested Prescriptions  Pending Prescriptions Disp Refills   clonazePAM (KLONOPIN) 0.5 MG tablet 90 tablet 2    Sig: Take 1.5 tablets (0.75 mg total) by mouth 2 (two) times daily.      Not Delegated - Psychiatry:  Anxiolytics/Hypnotics Failed - 10/26/2020  3:16 PM      Failed - This refill cannot be delegated      Failed - Urine Drug Screen completed in last 360 days      Passed - Valid encounter within last 6 months    Recent Outpatient Visits           2 months ago Annual physical exam   [facility]  Smitty Cords, DO   6 months ago Chronic pain of both shoulders   [facility] [place], [name], DO   8 months ago Chronic gout of right foot, unspecified cause   [facility] Smitty Cords, DO   11 months ago Acute gout of left ankle, unspecified cause   [facility] Smitty Cords, DO   1 year ago Annual physical exam   [facility] Smitty Cords, DO       Future Appointments             In 3 months Althea Charon, [name], DO [facility]

## 2020-10-27 MED ORDER — CLONAZEPAM 0.5 MG PO TABS: 0.7500 mg | ORAL_TABLET | Freq: Two times a day (BID) | ORAL | 2 refills | Status: DC

## 2020-11-28 NOTE — Progress Notes (Signed)
[address]       Vincent Dunn [address]             [phone]     CARDIOTHORACIC SURGERY CONSULTATION REPORT  Referring Provider is Saralyn Pilar * Primary Cardiologist is No primary care provider on file. PCP is Althea Charon [name], DO  Chief Complaint  Patient presents with  . Thoracic Aortic Aneurysm    Initial surgical consult, CT chest 08/17/20    HPI:  60 year old male is referred for evaluation of small ascending aortic aneurysm.  This was detected on low-dose CT scan of the chest for lung cancer screening.  He has no family history or personal history of aneurysm.  He denies chest pain or back pain.  The aneurysm measures approximately 4.1 cm in maximal diameter longer course of the ascending aorta.  Past Medical History:  Diagnosis Date  . Anxiety   . Thyroid disease     No past surgical history on file.  Family History  Problem Relation Age of Onset  . Cancer Mother        lung, brain  . Heart disease Father   . Dementia Sister     Social History   Socioeconomic History  . Marital status: Married    Spouse name: Not on file  . Number of children: Not on file  . Years of education: [employer]  . Highest education level: High school graduate  Occupational History  . Not on file  Tobacco Use  . Smoking status: Current Every Day Smoker    Packs/day: 1.00    Years: 45.00    Pack years: 45.00    Types: Cigarettes  . Smokeless tobacco: Never Used  . Tobacco comment: previous up to 1ppd  Substance and Sexual Activity  . Alcohol use: Yes    Alcohol/week: 10.0 standard drinks    Types: 10 Standard drinks or equivalent per week  . Drug use: Never  . Sexual activity: Not on file  Other Topics Concern  . Not on file  Social History Narrative  . Not on file   Social Determinants of Health   Financial Resource Strain: Not on file  Food Insecurity: Not on file  Transportation Needs: Not on file  Physical  Activity: Not on file  Stress: Not on file  Social Connections: Not on file  Intimate Partner Violence: Not on file    Current Outpatient Medications  Medication Sig Dispense Refill  . albuterol (VENTOLIN HFA) 108 (90 Base) MCG/ACT inhaler Inhale 1-2 puffs into the lungs every 6 (six) hours as needed for wheezing or shortness of breath (cough). 1 each 2  . allopurinol (ZYLOPRIM) 100 MG tablet Take 2 tablets (200 mg total) by mouth daily. 180 tablet 3  . cyclobenzaprine (FLEXERIL) 10 MG tablet Take 1 tablet (10 mg total) by mouth at bedtime. 90 tablet 3  . levothyroxine (SYNTHROID) 200 MCG tablet TAKE 1 TABLET(200 MCG) BY MOUTH DAILY BEFORE BREAKFAST 90 tablet 1  . clonazePAM (KLONOPIN) 0.5 MG tablet Take 1.5 tablets (0.75 mg total) by mouth 2 (two) times daily. 90 tablet 2  . colchicine 0.6 MG tablet TAKE 1 TABLET BY MOUTH EVERY DAY AS NEEDED FOR GOUT FLARE. REPEAT 1 PILL 2 HOURS LATER IF NEEDED. THEN CONTINUE DAILY FOR UP TO 3-5 DAYS AS NEEDED 30 tablet 3   No current facility-administered medications for this visit.    No Known Allergies    Review of Systems:   General:  No weight loss  Cardiac:  No chest pain or shortness of breath  Respiratory:  Long history of smoking, enrolled in lung cancer screening protocols  GI:   No abdominal pain or bleeding  GU:   Kidney or prostate disease  Vascular:  No history of claudication or DVTs  Neuro:   No history of stroke or TIAs  Musculoskeletal: No arthritis  Skin:   Negative  Psych:   Denies anxiety or depression  Eyes:   Negative  ENT:   Negative  Hematologic:  Denies bleeding or thrombosis  Endocrine:  No diabetes, does not check CBG's at home     Physical Exam:   BP (!) 156/91 (BP Location: Left Arm, Patient Position: Sitting)   Pulse 71   Resp 18   Ht 5\' 10"  (1.778 m)   Wt 91.3 kg   SpO2 95% Comment: RA with mask on  BMI 28.87 kg/m   General:    well-appearing  HEENT:  Unremarkable   Neck:   no JVD, no bruits, no  adenopathy   Chest:   clear to auscultation, symmetrical breath sounds, no wheezes, no rhonchi   CV:   RRR, no detectable murmur   Abdomen:  soft, non-tender, no masses   Extremities:  warm, well-perfused, pulses intact, no LE edema  Rectal/GU  Deferred  Neuro:   Grossly non-focal and symmetrical throughout  Skin:   Clean and dry, no rashes, no breakdown   Diagnostic Tests:  I have personally reviewed his available imaging studies including CT chest from November 2021 agree with their interpretation   Impression:  60 year old man with small incidentally discovered ascending aortic aneurysm not warranting surgical intervention at this time   Plan:  Follow-up in 6 months with repeat CT Smoking cessation Blood pressure control   I spent in excess of 20 minutes during the conduct of this office consultation and >50% of this time involved direct face-to-face encounter with the patient for counseling and/or coordination of their care.          Level 3 Office Consult = 40 minutes         Level 4 Office Consult = 60 minutes         Level 5 Office Consult = 80 minutes  B.  77, MD 11/28/2020 9:43 AM

## 2021-01-11 ENCOUNTER — Other Ambulatory Visit: Payer: Self-pay | Admitting: Family Medicine

## 2021-01-11 DIAGNOSIS — E039 Hypothyroidism, unspecified: Secondary | ICD-10-CM

## 2021-01-17 ENCOUNTER — Other Ambulatory Visit: Payer: Self-pay | Admitting: Family Medicine

## 2021-01-17 DIAGNOSIS — F411 Generalized anxiety disorder: Secondary | ICD-10-CM

## 2021-01-17 DIAGNOSIS — F410 Panic disorder [episodic paroxysmal anxiety] without agoraphobia: Secondary | ICD-10-CM

## 2021-01-17 NOTE — Telephone Encounter (Signed)
Requested medication (s) are due for refill today:   Provider to review  Requested medication (s) are on the active medication list:   Yes  Future visit scheduled:   Yes in 2 wks   Last ordered: 10/27/2020 #90, 2 refills  Non delegated refill   Requested Prescriptions  Pending Prescriptions Disp Refills   clonazePAM (KLONOPIN) 0.5 MG tablet [Pharmacy Med Name: CLONAZEPAM 0.5MG  TABLETS] 90 tablet     Sig: TAKE 1 AND 1/2 TABLETS(0.75 MG) BY MOUTH TWICE DAILY      Not Delegated - Psychiatry:  Anxiolytics/Hypnotics Failed - 01/17/2021 10:58 AM      Failed - This refill cannot be delegated      Failed - Urine Drug Screen completed in last 360 days      Passed - Valid encounter within last 6 months    Recent Outpatient Visits           5 months ago Annual physical exam   [facility] Smitty Cords, DO   9 months ago Chronic pain of both shoulders   [facility], [name], DO   11 months ago Chronic gout of right foot, unspecified cause   [facility] [place], [name], DO   1 year ago Acute gout of left ankle, unspecified cause   [facility] Smitty Cords, DO   1 year ago Annual physical exam   [facility] Smitty Cords, DO       Future Appointments             In 2 weeks Althea Charon, [name], DO [facility], [facility]

## 2021-01-26 ENCOUNTER — Other Ambulatory Visit: Payer: Self-pay

## 2021-01-26 ENCOUNTER — Other Ambulatory Visit: Payer: 59

## 2021-01-26 DIAGNOSIS — E782 Mixed hyperlipidemia: Secondary | ICD-10-CM

## 2021-01-26 DIAGNOSIS — E039 Hypothyroidism, unspecified: Secondary | ICD-10-CM

## 2021-01-27 LAB — LIPID PANEL
Cholesterol: 175 mg/dL (ref <200)
HDL: 42 mg/dL (ref >=40)
LDL Cholesterol (Calc): 101 mg/dL (calc) — ABNORMAL HIGH
Non-HDL Cholesterol (Calc): 133 mg/dL (calc) — ABNORMAL HIGH (ref <130)
Total CHOL/HDL Ratio: 4.2 (calc) (ref <5.0)
Triglycerides: 197 mg/dL — ABNORMAL HIGH (ref <150)

## 2021-01-27 LAB — TSH: TSH: 1.5 mIU/L (ref 0.40–4.50)

## 2021-01-27 LAB — T4, FREE: Free T4: 1.3 ng/dL (ref 0.8–1.8)

## 2021-02-02 ENCOUNTER — Other Ambulatory Visit: Payer: Self-pay

## 2021-02-02 ENCOUNTER — Encounter: Payer: Self-pay | Admitting: Family Medicine

## 2021-02-02 ENCOUNTER — Other Ambulatory Visit: Payer: Self-pay | Admitting: Family Medicine

## 2021-02-02 ENCOUNTER — Ambulatory Visit: Payer: 59 | Admitting: Family Medicine

## 2021-02-02 VITALS — BP 127/80 | HR 80 | Ht 70.0 in | Wt 190.0 lb

## 2021-02-02 DIAGNOSIS — M1A0710 Idiopathic chronic gout, right ankle and foot, without tophus (tophi): Secondary | ICD-10-CM | POA: Diagnosis not present

## 2021-02-02 DIAGNOSIS — J432 Centrilobular emphysema: Secondary | ICD-10-CM

## 2021-02-02 DIAGNOSIS — E039 Hypothyroidism, unspecified: Secondary | ICD-10-CM

## 2021-02-02 DIAGNOSIS — I712 Thoracic aortic aneurysm, without rupture, unspecified: Secondary | ICD-10-CM

## 2021-02-02 DIAGNOSIS — E782 Mixed hyperlipidemia: Secondary | ICD-10-CM

## 2021-02-02 DIAGNOSIS — Z0000 Encounter for general adult medical examination without abnormal findings: Secondary | ICD-10-CM

## 2021-02-02 DIAGNOSIS — Z125 Encounter for screening for malignant neoplasm of prostate: Secondary | ICD-10-CM

## 2021-02-02 DIAGNOSIS — R7309 Other abnormal glucose: Secondary | ICD-10-CM

## 2021-02-02 DIAGNOSIS — M25512 Pain in left shoulder: Secondary | ICD-10-CM

## 2021-02-02 DIAGNOSIS — G8929 Other chronic pain: Secondary | ICD-10-CM

## 2021-02-02 DIAGNOSIS — M25511 Pain in right shoulder: Secondary | ICD-10-CM

## 2021-02-02 MED ORDER — ALLOPURINOL 100 MG PO TABS: 200.0000 mg | ORAL_TABLET | Freq: Every day | ORAL | 11 refills | Status: DC

## 2021-02-02 MED ORDER — CYCLOBENZAPRINE HCL 10 MG PO TABS: 10.0000 mg | ORAL_TABLET | Freq: Every day | ORAL | 3 refills | Status: DC

## 2021-02-02 MED ORDER — TRELEGY ELLIPTA 100-62.5-25 MCG/INH IN AEPB: 1.0000 | INHALATION_SPRAY | Freq: Every day | RESPIRATORY_TRACT | 0 refills | Status: DC

## 2021-02-02 MED ORDER — ALBUTEROL SULFATE HFA 108 (90 BASE) MCG/ACT IN AERS: 1.0000 | INHALATION_SPRAY | Freq: Four times a day (QID) | RESPIRATORY_TRACT | 3 refills | Status: DC | PRN

## 2021-02-02 MED ORDER — LEVOTHYROXINE SODIUM 200 MCG PO TABS: ORAL_TABLET | ORAL | 11 refills | Status: DC

## 2021-02-02 NOTE — Assessment & Plan Note (Signed)
Significantly improved chronic recurrent gout of bilateral feet/ankles No actual flare in months since started allopurinol prophylaxis Only mild early symptoms, resolve with colchicine occasionally Improved uric acid 6.1 on last lab.  Plan: 1. CONTINUE Allopurinol 200mg  daily prophylaxis, refilled 2. Continue Colchicine 0.6mg  PRN early flare. Can repeat dose in 2 hours on day 1, then can take daily PRN for 1-5 days approx Avoid food triggers (red meat, alcohol) - goal to limit triggers to avoid adding prophylaxis medication in future, discussed can reconsider if interested Follow-up as need if not improve

## 2021-02-02 NOTE — Assessment & Plan Note (Signed)
Stable, chronic problem. Without flare Improved on temporary usage of maintenance therapy Trelegy, but declines need for daily use. Reviewed LDCT  Refilled Albuterol, usage has reduced.  Gave free sample again today Trelegy - may need rx order if ready to pursue.

## 2021-02-02 NOTE — Patient Instructions (Addendum)
Thank you for coming to the office today.  Refilled meds.  Sample inhaler, let me know if need another  1. TSH Thyroid Function Tests - Controlled on current Levothyroxine daily  2. Cholesterol - improved cholesterol total from 185 to 175, Triglyceride from 246 down to 197, LDL 109 down to 101. However, cardiovascular risk score still elevated. The 10-year ASCVD risk score Denman George DC Jr., et al., 2013) is: 19.8%  Reconsider Statin rx cholesterol med in future.   DUE for FASTING BLOOD WORK (no food or drink after midnight before the lab appointment, only water or coffee without cream/sugar on the morning of)  SCHEDULE "Lab Only" visit in the morning at the clinic for lab draw in 6 MONTHS  1 YEAR  - Make sure Lab Only appointment is at about 1 week before your next appointment, so that results will be available  For Lab Results, once available within 2-3 days of blood draw, you can can log in to MyChart online to view your results and a brief explanation. Also, we can discuss results at next follow-up visit.    Please schedule a Follow-up Appointment to: Return in about 6 months (around 08/04/2021) for 6 month follow-up fasting lab only then 1 week later Annual Physical.  If you have any other questions or concerns, please feel free to call the office or send a message through MyChart. You may also schedule an earlier appointment if necessary.  Additionally, you may be receiving a survey about your experience at our office within a few days to 1 week by e-mail or mail. We value your feedback.  Saralyn Pilar, DO [facility], [place]

## 2021-02-02 NOTE — Assessment & Plan Note (Signed)
Some improvement. But still labs show - Elevated LDL, TG, low HDL Last lipid panel 01/2021 The 10-year ASCVD risk score Vincent George DC Jr., et al., 2013) is: 14.3%  Plan: 1. Discussion on statin therapy and risk benefit ASCVD reduction - we have recommended Statin therapy, he prefers to still work on lifestyle diet regimen  2. Encourage improved lifestyle - low carb/cholesterol, reduce portion size, continue improving regular exercise

## 2021-02-02 NOTE — Assessment & Plan Note (Signed)
Identified incidentally on LDCT lung screening Without rupture. Asymptomatic. Has seen Cardiothoracic Surgery, 08/2020, determined q 6 month follow-up repeat CT imaging surveillance. No active concerns at this time.

## 2021-02-02 NOTE — Progress Notes (Signed)
Subjective:    Patient ID: Vincent Dunn, male    DOB: 08-11-1961, 60 y.o.   MRN: [id number]  Vincent Dunn is a 60 y.o. male presenting on 02/02/2021 for Hyperlipidemia and Hypothyroidism   HPI  Chronic Generalized Anxiety with panic attacks See last note for background. Continues on chronic Klonopin 0.75mg  BID with good results for his insomnia, had been on for >30 years No recent acute panic attacks. Failed SSRI in past lexapro, sertraline  Hypothyroidism Last lab 01/2021 - stable thyroid lab normal TSH Free T4 Taking levothyroxine Doing well needs refills  HYPERLIPIDEMIA: - Reports no concerns. Last lipid panel 01/2021, total from 185 to 175, Triglyceride from 246 down to 197, LDL 109 down to 101. However, cardiovascular risk score still elevated Not on statin. In past he took one statin before but only temporary  Centrilobular Emphysema Tobacco Abuse Chronic problem. Active smoker Trying to quit smoking, reduce and chip away, down to 1-2 cigs a week Prior LDCT imaging showed Emphysema On Albuterol PRN, few days a week. Needs refill. Has used maintenance therapy inhaler sample in past, request another, uses PRN.  Chronic Gout Last visit 03/2020, dose increase Allopurinol 100 to 200mg  daily, taking 2 pills instead of 1, and doing well no gout flares. - Last lab Uric Acid down to 6.1 (previously 9.8, 01/2020) - He is doing well and wants to keep dose - needs refills Has colchicine PRN flare if need   Depression screen [facility] 2/9 02/02/2021 08/03/2020 04/01/2020  Decreased Interest 0 0 0  Down, Depressed, Hopeless 0 0 0  PHQ - 2 Score 0 0 0  Altered sleeping 0 - -  Tired, decreased energy 0 - -  Change in appetite 0 - -  Feeling bad or failure about yourself  0 - -  Trouble concentrating 0 - -  Moving slowly or fidgety/restless 0 - -  Suicidal thoughts 0 - -  PHQ-9 Score 0 - -  Difficult doing work/chores Not difficult at all - -    Social History   Tobacco  Use  . Smoking status: Current Every Day Smoker    Packs/day: 1.00    Years: 45.00    Pack years: 45.00    Types: Cigarettes  . Smokeless tobacco: Never Used  . Tobacco comment: down to 1-2 cig per week  Substance Use Topics  . Alcohol use: Yes    Alcohol/week: 10.0 standard drinks    Types: 10 Standard drinks or equivalent per week  . Drug use: Never    Review of Systems Per HPI unless specifically indicated above     Objective:    BP 127/80   Pulse 80   Ht 5\' 10"  (1.778 m)   Wt 190 lb (86.2 kg)   SpO2 95%   BMI 27.26 kg/m   Wt Readings from Last 3 Encounters:  02/02/21 190 lb (86.2 kg)  09/19/20 201 lb 3.2 oz (91.3 kg)  08/17/20 200 lb (90.7 kg)    Physical Exam Vitals and nursing note reviewed.  Constitutional:      General: He is not in acute distress.    Appearance: He is well-developed. He is not diaphoretic.     Comments: Well-appearing, comfortable, cooperative  HENT:     Head: Normocephalic and atraumatic.  Eyes:     General:        Right eye: No discharge.        Left eye: No discharge.     Conjunctiva/sclera: Conjunctivae normal.  Neck:     Thyroid: No thyromegaly.  Cardiovascular:     Rate and Rhythm: Normal rate and regular rhythm.     Heart sounds: Normal heart sounds. No murmur heard.   Pulmonary:     Effort: Pulmonary effort is normal. No respiratory distress.     Breath sounds: Normal breath sounds. No wheezing or rales.  Musculoskeletal:        General: Normal range of motion.     Cervical back: Normal range of motion and neck supple.  Lymphadenopathy:     Cervical: No cervical adenopathy.  Skin:    General: Skin is warm and dry.     Findings: No erythema or rash.  Neurological:     Mental Status: He is alert and oriented to person, place, and time.  Psychiatric:        Behavior: Behavior normal.     Comments: Well groomed, good eye contact, normal speech and thoughts       I have personally reviewed the radiology report from  08/17/20 on LDCT.  CLINICAL DATA:  Lung cancer screening. Forty-five pack-year history. Current smoker, asymptomatic.  EXAM: CT CHEST WITHOUT CONTRAST LOW-DOSE FOR LUNG CANCER SCREENING  TECHNIQUE: Multidetector CT imaging of the chest was performed following the standard protocol without IV contrast.  COMPARISON:  None.  FINDINGS: Cardiovascular: Mild aneurysmal dilatation of the ascending thoracic aorta measures 4 cm. Mild aortic atherosclerosis and coronary artery calcifications. The heart size is normal. No pericardial effusion.  Mediastinum/Nodes: No enlarged mediastinal, hilar, or axillary lymph nodes. Thyroid gland, trachea, and esophagus demonstrate no significant findings.  Lungs/Pleura: No pleural effusion, airspace consolidation or atelectasis. Parenchymal bands identified within the right middle lobe, lingula and both lung bases compatible with postinflammatory scarring. Diffuse bronchial wall thickening and mild centrilobular emphysema noted. Small pulmonary nodules are identified. The largest is in the subpleural aspect of the right middle lobe measuring 4.1 mm.  Upper Abdomen: Tiny stones are identified within the gallbladder. 1.1 cm lipoma identified within the distal duodenum, image 69/2. Aortic atherosclerotic calcifications.  Musculoskeletal: No chest wall mass or suspicious bone lesions identified.  IMPRESSION: 1. Lung-RADS 2, benign appearance or behavior. Continue annual screening with low-dose chest CT without contrast in 12 months. 2. Diffuse bronchial wall thickening with emphysema, as above; imaging findings suggestive of underlying COPD. 3. 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 Recommend semi-annual imaging followup by CTA or MRA and referral to cardiothoracic surgery if not already obtained. This recommendation follows 2010 ACCF/AHA/AATS/ACR/ASA/SCA/SCAI/SIR/STS/SVM Guidelines for the Diagnosis and Management of  Patients With Thoracic Aortic Disease. Circulation. 2010; 121: W098-J19. Aortic aneurysm NOS (ICD10-I71.9) 4. Coronary artery calcifications. 5. Gallstones.  Aortic Atherosclerosis (ICD10-I70.0) and Emphysema (ICD10-J43.9).   Electronically Signed   By: Signa Kell M.D.   On: 08/18/2020 08:29  Results for orders placed or performed in visit on 01/26/21  T4, free  Result Value Ref Range   Free T4 1.3 0.8 - 1.8 ng/dL  TSH  Result Value Ref Range   TSH 1.50 0.40 - 4.50 mIU/L  Lipid panel  Result Value Ref Range   Cholesterol 175 <200 mg/dL   HDL 42 > OR = 40 mg/dL   Triglycerides 147 (H) <150 mg/dL   LDL Cholesterol (Calc) 101 (H) mg/dL (calc)   Total CHOL/HDL Ratio 4.2 <5.0 (calc)   Non-HDL Cholesterol (Calc) 133 (H) <130 mg/dL (calc)      Assessment & Plan:   Problem List Items Addressed This Visit    Thoracic  aortic aneurysm (HCC)    Identified incidentally on LDCT lung screening Without rupture. Asymptomatic. Has seen Cardiothoracic Surgery, 08/2020, determined q 6 month follow-up repeat CT imaging surveillance. No active concerns at this time.       Mixed hyperlipidemia    Some improvement. But still labs show - Elevated LDL, TG, low HDL Last lipid panel 01/2021 The 10-year ASCVD risk score Denman George DC Jr., et al., 2013) is: 14.3%  Plan: 1. Discussion on statin therapy and risk benefit ASCVD reduction - we have recommended Statin therapy, he prefers to still work on lifestyle diet regimen  2. Encourage improved lifestyle - low carb/cholesterol, reduce portion size, continue improving regular exercise      Chronic pain of both shoulders   Relevant Medications   cyclobenzaprine (FLEXERIL) 10 MG tablet   Chronic gout    Significantly improved chronic recurrent gout of bilateral feet/ankles No actual flare in months since started allopurinol prophylaxis Only mild early symptoms, resolve with colchicine occasionally Improved uric acid 6.1 on last  lab.  Plan: 1. CONTINUE Allopurinol 200mg  daily prophylaxis, refilled 2. Continue Colchicine 0.6mg  PRN early flare. Can repeat dose in 2 hours on day 1, then can take daily PRN for 1-5 days approx Avoid food triggers (red meat, alcohol) - goal to limit triggers to avoid adding prophylaxis medication in future, discussed can reconsider if interested Follow-up as need if not improve      Relevant Medications   allopurinol (ZYLOPRIM) 100 MG tablet   Centrilobular emphysema (HCC)    Stable, chronic problem. Without flare Improved on temporary usage of maintenance therapy Trelegy, but declines need for daily use. Reviewed LDCT  Refilled Albuterol, usage has reduced.  Gave free sample again today Trelegy - may need rx order if ready to pursue.      Relevant Medications   albuterol (VENTOLIN HFA) 108 (90 Base) MCG/ACT inhaler   TRELEGY ELLIPTA 100-62.5-25 MCG/INH AEPB   Acquired hypothyroidism - Primary    Stable, controlled Labs normal TSH T4 Refill continue Levothyroxine 11-06-1987 daily      Relevant Medications   levothyroxine (SYNTHROID) 200 MCG tablet        Meds ordered this encounter  Medications  . allopurinol (ZYLOPRIM) 100 MG tablet    Sig: Take 2 tablets (200 mg total) by mouth daily.    Dispense:  60 tablet    Refill:  11    Extra refills added for future  . levothyroxine (SYNTHROID) 200 MCG tablet    Sig: TAKE 1 TABLET(200 MCG) BY MOUTH DAILY BEFORE BREAKFAST    Dispense:  30 tablet    Refill:  11  . albuterol (VENTOLIN HFA) 108 (90 Base) MCG/ACT inhaler    Sig: Inhale 1-2 puffs into the lungs every 6 (six) hours as needed for wheezing or shortness of breath (cough).    Dispense:  1 each    Refill:  3  . cyclobenzaprine (FLEXERIL) 10 MG tablet    Sig: Take 1 tablet (10 mg total) by mouth at bedtime.    Dispense:  90 tablet    Refill:  3      Follow up plan: Return in about 6 months (around 08/04/2021) for 6 month follow-up fasting lab only then  1 week later Annual Physical.  Future labs ordered for 07/28/21  07/30/21, DO [facility] 02/02/2021, 8:11 AM

## 2021-02-02 NOTE — Addendum Note (Signed)
Addended by: Smitty Cords on: 02/02/2021 10:00 AM   Modules accepted: Orders

## 2021-02-02 NOTE — Assessment & Plan Note (Signed)
Stable, controlled Labs normal TSH T4 Refill continue Levothyroxine daily

## 2021-02-14 ENCOUNTER — Other Ambulatory Visit: Payer: Self-pay | Admitting: Family Medicine

## 2021-02-14 DIAGNOSIS — E039 Hypothyroidism, unspecified: Secondary | ICD-10-CM

## 2021-02-14 NOTE — Telephone Encounter (Signed)
Requested Prescriptions  Pending Prescriptions Disp Refills  . levothyroxine (SYNTHROID) 200 MCG tablet [Pharmacy Med Name: LEVOTHYROXINE 0.2MG  ( ) TAB] 30 tablet 11    Sig: TAKE 1 TABLET(200 MCG) BY MOUTH DAILY BEFORE AND BREAKFAST     Endocrinology:  Hypothyroid Agents Failed - 02/14/2021  9:05 AM      Failed - TSH needs to be rechecked within 3 months after an abnormal result. Refill until TSH is due.      Passed - TSH in normal range and within 360 days    TSH  Date Value Ref Range Status  01/26/2021 1.50 0.40 - 4.50 mIU/L Final         Passed - Valid encounter within last 12 months    Recent Outpatient Visits          1 week ago Acquired hypothyroidism   [facility] [place], [name], DO   6 months ago Annual physical exam   [facility] Smitty Cords, DO   10 months ago Chronic pain of both shoulders   [facility] [place], [name], DO   1 year ago Chronic gout of right foot, unspecified cause   [facility] Smitty Cords, DO   1 year ago Acute gout of left ankle, unspecified cause   [facility] Althea Charon [name], DO      Future Appointments            In 5 months Althea Charon, [name], DO [facility], [facility]

## 2021-02-21 ENCOUNTER — Other Ambulatory Visit: Payer: Self-pay | Admitting: *Deleted

## 2021-02-22 ENCOUNTER — Other Ambulatory Visit: Payer: Self-pay | Admitting: *Deleted

## 2021-02-22 DIAGNOSIS — I712 Thoracic aortic aneurysm, without rupture, unspecified: Secondary | ICD-10-CM

## 2021-02-22 DIAGNOSIS — R918 Other nonspecific abnormal finding of lung field: Secondary | ICD-10-CM

## 2021-03-23 ENCOUNTER — Other Ambulatory Visit: Payer: 59

## 2021-03-23 ENCOUNTER — Encounter: Payer: 59 | Admitting: Cardiothoracic Surgery

## 2021-04-13 ENCOUNTER — Other Ambulatory Visit: Payer: Self-pay | Admitting: Family Medicine

## 2021-04-13 DIAGNOSIS — F410 Panic disorder [episodic paroxysmal anxiety] without agoraphobia: Secondary | ICD-10-CM

## 2021-04-13 NOTE — Telephone Encounter (Signed)
Requested medication (s) are due for refill today:   Due 04/18/21  Requested medication (s) are on the active medication list: yes  Last refill: 01/17/21 # 90  2 refills  Future visit scheduled yes 08/04/21 with nurse   08/11/21 with PCP  Notes to clinic: not delegated  Requested Prescriptions  Pending Prescriptions Disp Refills   clonazePAM (KLONOPIN) 0.5 MG tablet [Pharmacy Med Name: CLONAZEPAM 0.5MG  TABLETS] 90 tablet     Sig: TAKE 1 AND 1/2 TABLETS(0.75 MG) BY MOUTH TWICE DAILY      Not Delegated - Psychiatry:  Anxiolytics/Hypnotics Failed - 04/13/2021 12:37 PM      Failed - This refill cannot be delegated      Failed - Urine Drug Screen completed in last 360 days      Passed - Valid encounter within last 6 months    Recent Outpatient Visits           2 months ago Acquired hypothyroidism   [facility] Smitty Cords, DO   8 months ago Annual physical exam   [facility] Smitty Cords, DO   1 year ago Chronic pain of both shoulders   [facility], [name], DO   1 year ago Chronic gout of right foot, unspecified cause   [facility] Smitty Cords, DO   1 year ago Acute gout of left ankle, unspecified cause   [facility] Althea Charon, [name], DO       Future Appointments             In 4 months Althea Charon, [name], DO [facility], [facility]

## 2021-04-17 ENCOUNTER — Other Ambulatory Visit: Payer: Self-pay | Admitting: Family Medicine

## 2021-04-17 DIAGNOSIS — G8929 Other chronic pain: Secondary | ICD-10-CM

## 2021-04-17 DIAGNOSIS — M25511 Pain in right shoulder: Secondary | ICD-10-CM

## 2021-04-17 NOTE — Telephone Encounter (Signed)
Requested medication (s) are due for refill today: No  Requested medication (s) are on the active medication list: Yes  Last refill:  02/02/21  Future visit scheduled: Yes  Notes to clinic:  See request.    Requested Prescriptions  Pending Prescriptions Disp Refills   cyclobenzaprine (FLEXERIL) 10 MG tablet [Pharmacy Med Name: CYCLOBENZAPRINE 10MG  TABLETS] 90 tablet 3    Sig: TAKE 1 TABLET(10 MG) BY MOUTH AT BEDTIME      Not Delegated - Analgesics:  Muscle Relaxants Failed - 04/17/2021  9:20 AM      Failed - This refill cannot be delegated      Passed - Valid encounter within last 6 months    Recent Outpatient Visits           2 months ago Acquired hypothyroidism   [facility], DO   8 months ago Annual physical exam   [facility], DO   1 year ago Chronic pain of both shoulders   [facility] [place], [place], DO   1 year ago Chronic gout of right foot, unspecified cause   [facility] [place], [place], DO   1 year ago Acute gout of left ankle, unspecified cause   [facility], Althea Charon, DO       Future Appointments             In 3 months [name], Althea Charon, DO [facility], [facility]               cyclobenzaprine (FLEXERIL) 10 MG tablet [Pharmacy Med Name: CYCLOBENZAPRINE 10MG  TABLETS] 90 tablet 3    Sig: TAKE 1 TABLET(10 MG) BY MOUTH AT BEDTIME      Not Delegated - Analgesics:  Muscle Relaxants Failed - 04/17/2021  9:20 AM      Failed - This refill cannot be delegated      Passed - Valid encounter within last 6 months    Recent Outpatient Visits           2 months ago Acquired hypothyroidism   [facility] [place], [facility], DO   8 months ago Annual physical exam   [facility] [name], DO   1 year ago Chronic pain of both  shoulders   [facility], [facility], DO   1 year ago Chronic gout of right foot, unspecified cause   [facility] [place], [facility], DO   1 year ago Acute gout of left ankle, unspecified cause   [facility] [name], [facility], DO       Future Appointments             In 3 months Althea Charon, [name], DO [facility], [facility]

## 2021-07-11 ENCOUNTER — Other Ambulatory Visit: Payer: Self-pay | Admitting: Family Medicine

## 2021-07-11 DIAGNOSIS — F410 Panic disorder [episodic paroxysmal anxiety] without agoraphobia: Secondary | ICD-10-CM

## 2021-07-11 DIAGNOSIS — F411 Generalized anxiety disorder: Secondary | ICD-10-CM

## 2021-07-11 NOTE — Telephone Encounter (Signed)
Requested medication (s) are due for refill today:due 07/14/21  Requested medication (s) are on the active medication list: yes   Last refill: 04/13/21  #90 2 refills  Future visit scheduled yes 08/11/21  Notes to clinic: not delegated  Requested Prescriptions  Pending Prescriptions Disp Refills   clonazePAM (KLONOPIN) 0.5 MG tablet [Pharmacy Med Name: CLONAZEPAM 0.5MG  TABLETS] 90 tablet     Sig: TAKE 1 AND 1/2 TABLETS(0.75 MG) BY MOUTH TWICE DAILY     Not Delegated - Psychiatry:  Anxiolytics/Hypnotics Failed - 07/11/2021  1:14 PM      Failed - This refill cannot be delegated      Failed - Urine Drug Screen completed in last 360 days      Passed - Valid encounter within last 6 months    Recent Outpatient Visits           5 months ago Acquired hypothyroidism   [facility] Smitty Cords, DO   11 months ago Annual physical exam   [facility] Smitty Cords, DO   1 year ago Chronic pain of both shoulders   [facility] [place], [name], DO   1 year ago Chronic gout of right foot, unspecified cause   [facility] [place], [name], DO   1 year ago Acute gout of left ankle, unspecified cause   [facility] Althea Charon, [name], DO       Future Appointments             In 1 month Althea Charon, [name], DO [facility], [facility]

## 2021-07-12 ENCOUNTER — Other Ambulatory Visit: Payer: Self-pay | Admitting: Family Medicine

## 2021-07-12 DIAGNOSIS — F410 Panic disorder [episodic paroxysmal anxiety] without agoraphobia: Secondary | ICD-10-CM

## 2021-07-12 NOTE — Telephone Encounter (Signed)
Requested medication (s) are due for refill today: Due 07/14/21  Requested medication (s) are on the active medication list: yes  Last refill: 04/13/21  #90  2 refills  Future visit scheduled yes  08/11/21  Notes to clinic: Not delegated  Requested Prescriptions  Pending Prescriptions Disp Refills   clonazePAM (KLONOPIN) 0.5 MG tablet [Pharmacy Med Name: CLONAZEPAM 0.5MG  TABLETS] 90 tablet     Sig: TAKE 1 AND 1/2 TABLETS(0.75 MG) BY MOUTH TWICE DAILY     Not Delegated - Psychiatry:  Anxiolytics/Hypnotics Failed - 07/12/2021 12:07 PM      Failed - This refill cannot be delegated      Failed - Urine Drug Screen completed in last 360 days      Passed - Valid encounter within last 6 months    Recent Outpatient Visits           5 months ago Acquired hypothyroidism   [facility] Smitty Cords, DO   11 months ago Annual physical exam   [facility] Smitty Cords, DO   1 year ago Chronic pain of both shoulders   [facility] [place], [name], DO   1 year ago Chronic gout of right foot, unspecified cause   [facility] [place], [name], DO   1 year ago Acute gout of left ankle, unspecified cause   [facility] Althea Charon, [name], DO       Future Appointments             In 1 month Althea Charon, [name], DO [facility], [facility]

## 2021-07-28 ENCOUNTER — Other Ambulatory Visit: Payer: 59

## 2021-08-04 ENCOUNTER — Encounter: Payer: 59 | Admitting: Family Medicine

## 2021-08-04 ENCOUNTER — Other Ambulatory Visit: Payer: 59

## 2021-08-11 ENCOUNTER — Encounter: Payer: 59 | Admitting: Family Medicine

## 2021-10-10 ENCOUNTER — Other Ambulatory Visit: Payer: Self-pay | Admitting: Family Medicine

## 2021-10-10 DIAGNOSIS — F410 Panic disorder [episodic paroxysmal anxiety] without agoraphobia: Secondary | ICD-10-CM

## 2021-10-10 NOTE — Telephone Encounter (Signed)
Requested medication (s) are due for refill today: yes  Requested medication (s) are on the active medication list: yes  Last refill:  07/12/21 #90 with 2 refills   Future visit scheduled: no   Notes to clinic:  Please review for refill. Refill not delegated per protocol.     Requested Prescriptions  Pending Prescriptions Disp Refills   clonazePAM (KLONOPIN) 0.5 MG tablet [Pharmacy Med Name: CLONAZEPAM 0.5MG  TABLETS] 90 tablet     Sig: TAKE 1 AND 1/2 TABLETS(0.75 MG) BY MOUTH TWICE DAILY     Not Delegated - Psychiatry:  Anxiolytics/Hypnotics Failed - 10/10/2021 12:31 PM      Failed - This refill cannot be delegated      Failed - Urine Drug Screen completed in last 360 days      Failed - Valid encounter within last 6 months    Recent Outpatient Visits           8 months ago Acquired hypothyroidism   [facility] Smitty Cords, DO   1 year ago Annual physical exam   [facility] Smit[facility] C[facility], DO   1 year ago Chronic pain of both shoulders   [facility] [place], [name], DO   1 year ago Chronic gout of right foot, unspecified cause   [facility], [name], DO   1 year ago Acute gout of left ankle, unspecified cause   [facility], [name], DO

## 2021-11-24 ENCOUNTER — Other Ambulatory Visit: Payer: Self-pay | Admitting: Family Medicine

## 2021-11-24 DIAGNOSIS — J432 Centrilobular emphysema: Secondary | ICD-10-CM

## 2021-11-25 NOTE — Telephone Encounter (Signed)
Refusing due to has newer rx at same pharm. ° °Requested Prescriptions  °Pending Prescriptions Disp Refills  °• albuterol (VENTOLIN HFA) 108 (90 Base) MCG/ACT inhaler [Pharmacy Med Name: ALBUTEROL HFA INH (200 PUFFS) 6.7GM] 6.7 g   °  Sig: INHALE 1 TO 2 PUFFS INTO THE LUNGS EVERY 6 HOURS AS NEEDED FOR WHEEZING OR SHORTNESS OF BREATH OR COUGH  °  ° Pulmonology:  Beta Agonists 2 Passed - 11/24/2021  5:33 PM  °  °  Passed - Last BP in normal range  °  BP Readings from Last 1 Encounters:  °02/02/21 127/80  °   °  °  Passed - Last Heart Rate in normal range  °  Pulse Readings from Last 1 Encounters:  °02/02/21 80  °   °  °  Passed - Valid encounter within last 12 months  °  Recent Outpatient Visits   °      ° 9 months ago Acquired hypothyroidism  ° [facility] [place], Alexander J, DO  ° 1 year ago Annual physical exam  ° [facility] [place], Alexander J, DO  ° 1 year ago Chronic pain of both shoulders  ° [facility] [place], Alexander J, DO  ° 1 year ago Chronic gout of right foot, unspecified cause  ° [facility] [place], Alexander J, DO  ° 2 years ago Acute gout of left ankle, unspecified cause  ° [facility] [place], Alexander J, DO  °  °  ° °  °  °  ° ° °

## 2021-11-25 NOTE — Telephone Encounter (Signed)
Refusing due to has newer rx at same pharm.  Requested Prescriptions  Pending Prescriptions Disp Refills   albuterol (VENTOLIN HFA) 108 (90 Base) MCG/ACT inhaler [Pharmacy Med Name: ALBUTEROL HFA INH (200 PUFFS) 6.7GM] 6.7 g     Sig: INHALE 1 TO 2 PUFFS INTO THE LUNGS EVERY 6 HOURS AS NEEDED FOR WHEEZING OR SHORTNESS OF BREATH OR COUGH     Pulmonology:  Beta Agonists 2 Passed - 11/24/2021  5:33 PM      Passed - Last BP in normal range    BP Readings from Last 1 Encounters:  02/02/21 127/80         Passed - Last Heart Rate in normal range    Pulse Readings from Last 1 Encounters:  02/02/21 80         Passed - Valid encounter within last 12 months    Recent Outpatient Visits          9 months ago Acquired hypothyroidism   [facility], [name], DO   1 year ago Annual physical exam   [facility] Smitty Cords, DO   1 year ago Chronic pain of both shoulders   [facility] [place], [name], DO   1 year ago Chronic gout of right foot, unspecified cause   [facility] [place], [name], DO   2 years ago Acute gout of left ankle, unspecified cause   [facility] [place], [name], DO

## 2021-12-11 ENCOUNTER — Telehealth: Payer: Self-pay | Admitting: Acute Care

## 2021-12-11 NOTE — Telephone Encounter (Signed)
Left message for pt to call back to schedule f/u lung screening CT scan.  ?

## 2022-01-08 ENCOUNTER — Other Ambulatory Visit: Payer: Self-pay | Admitting: Family Medicine

## 2022-01-08 DIAGNOSIS — F410 Panic disorder [episodic paroxysmal anxiety] without agoraphobia: Secondary | ICD-10-CM

## 2022-01-09 NOTE — Telephone Encounter (Signed)
Requested medications are due for refill today.  yes ? ?Requested medications are on the active medications list.  yes ? ?Last refill. 10/11/2021 #90 2 refills ? ?Future visit scheduled.   no ? ?Notes to clinic.  Medication refill is not delegated. ? ? ? ?Requested Prescriptions  ?Pending Prescriptions Disp Refills  ? clonazePAM (KLONOPIN) 0.5 MG tablet [Pharmacy Med Name: CLONAZEPAM 0.5MG  TABLETS] 90 tablet   ?  Sig: TAKE 1 AND 1/2 TABLETS(0.75 MG) BY MOUTH TWICE DAILY  ?  ? Not Delegated - Psychiatry: Anxiolytics/Hypnotics 2 Failed - 01/08/2022 10:08 AM  ?  ?  Failed - This refill cannot be delegated  ?  ?  Failed - Urine Drug Screen completed in last 360 days  ?  ?  Failed - Valid encounter within last 6 months  ?  Recent Outpatient Visits   ? ?      ? 11 months ago Acquired hypothyroidism  ? [facility] Althea Charon, [name], DO  ? 1 year ago Annual physical exam  ? [facility] Smitty Cords, DO  ? 1 year ago Chronic pain of both shoulders  ? [facility], [name], DO  ? 1 year ago Chronic gout of right foot, unspecified cause  ? [facility] [place], [name], DO  ? 2 years ago Acute gout of left ankle, unspecified cause  ? [facility] Smitty Cords, DO  ? ?  ?  ? ?  ?  ?  Passed - Patient is not pregnant  ?  ?  ?  ?

## 2022-01-11 ENCOUNTER — Other Ambulatory Visit: Payer: Self-pay | Admitting: Family Medicine

## 2022-01-11 DIAGNOSIS — E039 Hypothyroidism, unspecified: Secondary | ICD-10-CM

## 2022-01-11 DIAGNOSIS — M1A0710 Idiopathic chronic gout, right ankle and foot, without tophus (tophi): Secondary | ICD-10-CM

## 2022-01-11 NOTE — Telephone Encounter (Signed)
Requested medications are due for refill today.  yes ? ?Requested medications are on the active medications list.  yes ? ?Last refill. Allopurinol 02/02/2021 #60 11 refills, Levothyroxine 02/14/2021 #30 11 refills. ? ?Future visit scheduled.   no ? ?Notes to clinic.  Called pt to schedule follow up appointment. Pt would like a call back form front desk/billing regarding fees for OV and labs. ? ? ? ?Requested Prescriptions  ?Pending Prescriptions Disp Refills  ? allopurinol (ZYLOPRIM) 100 MG tablet [Pharmacy Med Name: ALLOPURINOL 100MG  TABLETS] 60 tablet 11  ?  Sig: TAKE 2 TABLETS(200 MG) BY MOUTH DAILY  ?  ? Endocrinology:  Gout Agents - allopurinol Failed - 01/11/2022  8:03 AM  ?  ?  Failed - Uric Acid in normal range and within 360 days  ?  Uric Acid, Serum  ?Date Value Ref Range Status  ?07/27/2020 6.1 4.0 - 8.0 mg/dL Final  ?  Comment:  ?  Therapeutic target for gout patients: <6.0 mg/dL ?. ?  ?  ?  ?  ?  Failed - Cr in normal range and within 360 days  ?  Creat  ?Date Value Ref Range Status  ?07/27/2020 0.92 0.70 - 1.25 mg/dL Final  ?  Comment:  ?  For patients >62 years of age, the reference limit ?for Creatinine is approximately 13% higher for people ?identified as African-American. ?. ?  ?  ?  ?  ?  Failed - CBC within normal limits and completed in the last 12 months  ?  WBC  ?Date Value Ref Range Status  ?07/27/2020 7.7 3.8 - 10.8 Thousand/uL Final  ? ?RBC  ?Date Value Ref Range Status  ?07/27/2020 5.03 4.20 - 5.80 Million/uL Final  ? ?Hemoglobin  ?Date Value Ref Range Status  ?07/27/2020 16.3 13.2 - 17.1 g/dL Final  ? ?HGB  ?Date Value Ref Range Status  ?08/09/2012 14.7 13.0 - 18.0 g/dL Final  ? ?HCT  ?Date Value Ref Range Status  ?07/27/2020 48.0 38.5 - 50.0 % Final  ?08/09/2012 43.4 40.0 - 52.0 % Final  ? ?MCHC  ?Date Value Ref Range Status  ?07/27/2020 34.0 32.0 - 36.0 g/dL Final  ? ?MCH  ?Date Value Ref Range Status  ?07/27/2020 32.4 27.0 - 33.0 pg Final  ? ?MCV  ?Date Value Ref Range Status  ?07/27/2020  95.4 80.0 - 100.0 fL Final  ?08/09/2012 95 80 - 100 fL Final  ? ?No results found for: PLTCOUNTKUC, LABPLAT, POCPLA ?RDW  ?Date Value Ref Range Status  ?07/27/2020 13.5 11.0 - 15.0 % Final  ?08/09/2012 13.5 11.5 - 14.5 % Final  ? ?  ?  ?  Passed - Valid encounter within last 12 months  ?  Recent Outpatient Visits   ? ?      ? 11 months ago Acquired hypothyroidism  ? [facility], Althea Charon, DO  ? 1 year ago Annual physical exam  ? [facility], DO  ? 1 year ago Chronic pain of both shoulders  ? [facility] [place], [place], [place]  ? 1 year ago Chronic gout of right foot, unspecified cause  ? [facility] [place], [place], DO  ? 2 years ago Acute gout of left ankle, unspecified cause  ? [facility], DO  ? ?  ?  ? ?  ?  ?  ? levothyroxine (SYNTHROID) 200 MCG tablet [Pharmacy Med Name: LEVOTHYROXINE 0.2MG  (Smitty Cords)  TAB] 30 tablet 11  ?  Sig: TAKE 1 TABLET(200 MCG) BY MOUTH DAILY BEFORE BREAKFAST  ?  ? Endocrinology:  Hypothyroid Agents Passed - 01/11/2022  8:03 AM  ?  ?  Passed - TSH in normal range and within 360 days  ?  TSH  ?Date Value Ref Range Status  ?01/26/2021 1.50 0.40 - 4.50 mIU/L Final  ?  ?  ?  ?  Passed - Valid encounter within last 12 months  ?  Recent Outpatient Visits   ? ?      ? 11 months ago Acquired hypothyroidism  ? [facility] Althea Charon, [name], DO  ? 1 year ago Annual physical exam  ? [facility] Smitty Cords, DO  ? 1 year ago Chronic pain of both shoulders  ? [facility] [place], [name], DO  ? 1 year ago Chronic gout of right foot, unspecified cause  ? [facility] [place], [name], DO  ? 2 years ago Acute gout of left ankle, unspecified cause  ? [facility] Smitty Cords, DO  ? ?  ?  ? ?  ?  ?  ?  ?

## 2022-01-11 NOTE — Telephone Encounter (Signed)
Called pt to schedule appointment. Pt no longer has insurance and needs to know cost of OV and needed labs. Please return his call with this information. ?

## 2022-01-24 ENCOUNTER — Other Ambulatory Visit: Payer: Self-pay | Admitting: Family Medicine

## 2022-01-24 DIAGNOSIS — J432 Centrilobular emphysema: Secondary | ICD-10-CM

## 2022-01-25 NOTE — Telephone Encounter (Signed)
Requested Prescriptions  ?Pending Prescriptions Disp Refills  ?? albuterol (VENTOLIN HFA) 108 (90 Base) MCG/ACT inhaler [Pharmacy Med Name: ALBUTEROL HFA INH (200 PUFFS) 6.7GM] 6.7 g 0  ?  Sig: INHALE 1 TO 2 PUFFS INTO THE LUNGS EVERY 6 HOURS AS NEEDED FOR WHEEZING OR SHORTNESS OF BREATH OR COUGH  ?  ? Pulmonology:  Beta Agonists 2 Passed - 01/24/2022  7:28 PM  ?  ?  Passed - Last BP in normal range  ?  BP Readings from Last 1 Encounters:  ?02/02/21 127/80  ?   ?  ?  Passed - Last Heart Rate in normal range  ?  Pulse Readings from Last 1 Encounters:  ?02/02/21 80  ?   ?  ?  Passed - Valid encounter within last 12 months  ?  Recent Outpatient Visits   ?      ? 11 months ago Acquired hypothyroidism  ? [facility] Althea Charon, [name], DO  ? 1 year ago Annual physical exam  ? [facility] Smitty Cords, DO  ? 1 year ago Chronic pain of both shoulders  ? [facility] [place], [name], DO  ? 1 year ago Chronic gout of right foot, unspecified cause  ? [facility] [place], [name], DO  ? 2 years ago Acute gout of left ankle, unspecified cause  ? [facility] Smitty Cords, DO  ?  ?  ? ?  ?  ?  ? ? ?

## 2022-02-15 ENCOUNTER — Other Ambulatory Visit: Payer: Self-pay | Admitting: Family Medicine

## 2022-02-15 DIAGNOSIS — E039 Hypothyroidism, unspecified: Secondary | ICD-10-CM

## 2022-02-15 NOTE — Telephone Encounter (Signed)
this med was sent to requesting pharmacy on 01/12/22 #30 5 RF ? ?Requested Prescriptions  ?Refused Prescriptions Disp Refills  ?? levothyroxine (SYNTHROID) 200 MCG tablet 30 tablet 5  ?  Sig: TAKE 1 TABLET(200 MCG) BY MOUTH DAILY BEFORE BREAKFAST  ?  ? Endocrinology:  Hypothyroid Agents Failed - 02/15/2022  1:49 PM  ?  ?  Failed - TSH in normal range and within 360 days  ?  TSH  ?Date Value Ref Range Status  ?01/26/2021 1.50 0.40 - 4.50 mIU/L Final  ?   ?  ?  Failed - Valid encounter within last 12 months  ?  Recent Outpatient Visits   ?      ? 1 year ago Acquired hypothyroidism  ? [facility] Althea Charon, [name], DO  ? 1 year ago Annual physical exam  ? [facility] Smitty Cords, DO  ? 1 year ago Chronic pain of both shoulders  ? [facility] [place], [name], DO  ? 2 years ago Chronic gout of right foot, unspecified cause  ? [facility] [place], [name], DO  ? 2 years ago Acute gout of left ankle, unspecified cause  ? [facility] Smitty Cords, DO  ?  ?  ?Future Appointments   ?        ? In 6 days Althea Charon, [name], DO [facility]  ?  ? ?  ?  ?  ? ? ?

## 2022-02-15 NOTE — Telephone Encounter (Signed)
Medication Refill - Medication:  ? ?levothyroxine (SYNTHROID) 200 MCG tablet  ? ?Has the patient contacted their pharmacy? Yes.  Pt stated they wont refill without an appt.  ?(Agent: If no, request that the patient contact the pharmacy for the refill. If patient does not wish to contact the pharmacy document the reason why and proceed with request.) ?(Agent: If yes, when and what did the pharmacy advise?) ? ?Preferred Pharmacy (with phone number or street name): ? ?[id number] [place], [place] - [address] AT [facility]  ?[address] [place] [address]  ?Phone: [phone] Fax: [phone]  ? ?Has the patient been seen for an appointment in the last year OR does the patient have an upcoming appointment? Yes.   ? ?Agent: Please be advised that RX refills may take up to 3 business days. We ask that you follow-up with your pharmacy. ? ?

## 2022-02-16 ENCOUNTER — Telehealth: Payer: Self-pay | Admitting: Family Medicine

## 2022-02-16 NOTE — Telephone Encounter (Signed)
Pt is calling to ask what is EOB would be for his OV next week. Pt has no insurance. Please advise ?[phone] ?

## 2022-02-19 ENCOUNTER — Ambulatory Visit: Payer: 59 | Admitting: Family Medicine

## 2022-02-21 ENCOUNTER — Ambulatory Visit: Payer: Self-pay | Admitting: Family Medicine

## 2022-02-21 ENCOUNTER — Encounter: Payer: Self-pay | Admitting: Family Medicine

## 2022-02-21 DIAGNOSIS — G8929 Other chronic pain: Secondary | ICD-10-CM

## 2022-02-21 DIAGNOSIS — F411 Generalized anxiety disorder: Secondary | ICD-10-CM

## 2022-02-21 DIAGNOSIS — J432 Centrilobular emphysema: Secondary | ICD-10-CM

## 2022-02-21 DIAGNOSIS — M1A0710 Idiopathic chronic gout, right ankle and foot, without tophus (tophi): Secondary | ICD-10-CM

## 2022-02-21 DIAGNOSIS — F410 Panic disorder [episodic paroxysmal anxiety] without agoraphobia: Secondary | ICD-10-CM

## 2022-02-21 DIAGNOSIS — M25511 Pain in right shoulder: Secondary | ICD-10-CM

## 2022-02-21 DIAGNOSIS — M25512 Pain in left shoulder: Secondary | ICD-10-CM

## 2022-02-21 MED ORDER — BREZTRI AEROSPHERE 160-9-4.8 MCG/ACT IN AERO: 2.0000 | INHALATION_SPRAY | Freq: Two times a day (BID) | RESPIRATORY_TRACT | 0 refills | Status: DC

## 2022-02-21 MED ORDER — CYCLOBENZAPRINE HCL 10 MG PO TABS: 10.0000 mg | ORAL_TABLET | Freq: Every day | ORAL | 5 refills | Status: DC

## 2022-02-21 MED ORDER — CLONAZEPAM 0.5 MG PO TABS: ORAL_TABLET | ORAL | 5 refills | Status: DC

## 2022-02-21 MED ORDER — COLCHICINE 0.6 MG PO TABS: ORAL_TABLET | ORAL | 2 refills | Status: DC

## 2022-02-21 MED ORDER — ALBUTEROL SULFATE HFA 108 (90 BASE) MCG/ACT IN AERS: 1.0000 | INHALATION_SPRAY | Freq: Four times a day (QID) | RESPIRATORY_TRACT | 5 refills | Status: DC | PRN

## 2022-02-21 NOTE — Progress Notes (Signed)
? ?Subjective:  ? ? Patient ID: Vincent Dunn, male    DOB: 12/13/1960, 61 y.o.   MRN: [id number] ? ?Vincent Dunn is a 61 y.o. male presenting on 02/21/2022 for Anxiety and COPD ? ? ?HPI ? ?Last visit 01/2021, he has since lost his insurance coverage. His job closed and now is between [employer]. ? ?Anxiety ?Previously controlled on Klonopin 1.5 tab twice a day, for years. Doing well needs re order. ?He has remained out of Klonopin for past 1 week, feels off from this. ? ?Centrilobular Emphysema / COPD ?LDCT screening in past.  ?Difficulty with breathing at times. Previously on medication but now w/o insurance he does not have maintenance therapy ?He remains physically active daily with 2 jobs. ?He can feel winded worse if laying down in evening ?He uses ALbuterol PRN needs new order. ?Previously on Trelegy sample with good results only used 1 puff occasionally. ?He wears compression socks. Not having significant swelling overall. ? ?Gout ?Out of his gout medication. Feels fluid swelling. ? ?Hypothyroidism ?Continues on Levothyroxine 259mcg daily. ? ?History of Plantar Fasciitis ? ? ?PMH Thoracic Aortic Aneurysm ?Seen on prior LDCT, had repeat imaging, no change. Advised repeat 6 month, then insurance denied. ? ? ?  02/21/2022  ?  3:40 PM 02/02/2021  ?  8:06 AM 08/03/2020  ?  8:59 AM  ?Depression screen PHQ 2/9  ?Decreased Interest 0 0 0  ?Down, Depressed, Hopeless 0 0 0  ?PHQ - 2 Score 0 0 0  ?Altered sleeping 2 0   ?Tired, decreased energy 1 0   ?Change in appetite 0 0   ?Feeling bad or failure about yourself  0 0   ?Trouble concentrating 0 0   ?Moving slowly or fidgety/restless 0 0   ?Suicidal thoughts 0 0   ?PHQ-9 Score 3 0   ?Difficult doing work/chores Not difficult at all Not difficult at all   ? ? ?Social History  ? ?Tobacco Use  ? Smoking status: Every Day  ?  Packs/day: 1.00  ?  Years: 45.00  ?  Pack years: 45.00  ?  Types: Cigarettes  ? Smokeless tobacco: Never  ? Tobacco comments:  ?  down to 1-2 cig per  week  ?Substance Use Topics  ? Alcohol use: Yes  ?  Alcohol/week: 10.0 standard drinks  ?  Types: 10 Standard drinks or equivalent per week  ? Drug use: Never  ? ? ?Review of Systems ?Per HPI unless specifically indicated above ? ?   ?Objective:  ?  ?BP 136/80   Pulse 73   Ht 5\' 10"  (1.778 m)   Wt 191 lb 3.2 oz (86.7 kg)   SpO2 99%   BMI 27.43 kg/m?   ?Wt Readings from Last 3 Encounters:  ?02/21/22 191 lb 3.2 oz (86.7 kg)  ?02/02/21 190 lb (86.2 kg)  ?09/19/20 201 lb 3.2 oz (91.3 kg)  ?  ?Physical Exam ?Vitals and nursing note reviewed.  ?Constitutional:   ?   General: He is not in acute distress. ?   Appearance: He is well-developed. He is not diaphoretic.  ?   Comments: Well-appearing, comfortable, cooperative  ?HENT:  ?   Head: Normocephalic and atraumatic.  ?Eyes:  ?   General:     ?   Right eye: No discharge.     ?   Left eye: No discharge.  ?   Conjunctiva/sclera: Conjunctivae normal.  ?Neck:  ?   Thyroid: No thyromegaly.  ?Cardiovascular:  ?   Rate  and Rhythm: Normal rate and regular rhythm.  ?   Pulses: Normal pulses.  ?   Heart sounds: Normal heart sounds. No murmur heard. ?Pulmonary:  ?   Effort: Pulmonary effort is normal. No respiratory distress.  ?   Breath sounds: Wheezing present. No rales.  ?Musculoskeletal:     ?   General: Normal range of motion.  ?   Cervical back: Normal range of motion and neck supple.  ?Lymphadenopathy:  ?   Cervical: No cervical adenopathy.  ?Skin: ?   General: Skin is warm and dry.  ?   Findings: No erythema or rash.  ?Neurological:  ?   Mental Status: He is alert and oriented to person, place, and time. Mental status is at baseline.  ?Psychiatric:     ?   Behavior: Behavior normal.  ?   Comments: Well groomed, good eye contact, normal speech and thoughts  ? ? ? ?Results for orders placed or performed in visit on 01/26/21  ?T4, free  ?Result Value Ref Range  ? Free T4 1.3 0.8 - 1.8 ng/dL  ?TSH  ?Result Value Ref Range  ? TSH 1.50 0.40 - 4.50 mIU/L  ?Lipid panel  ?Result  Value Ref Range  ? Cholesterol 175 <200 mg/dL  ? HDL 42 > OR = 40 mg/dL  ? Triglycerides 197 (H) <150 mg/dL  ? LDL Cholesterol (Calc) 101 (H) mg/dL (calc)  ? Total CHOL/HDL Ratio 4.2 <5.0 (calc)  ? Non-HDL Cholesterol (Calc) 133 (H) <130 mg/dL (calc)  ? ?   ?Assessment & Plan:  ? ?Problem List Items Addressed This Visit   ? ? Generalized anxiety disorder with panic attacks  ? Relevant Medications  ? clonazePAM (KLONOPIN) 0.5 MG tablet  ? Chronic pain of both shoulders  ? Relevant Medications  ? clonazePAM (KLONOPIN) 0.5 MG tablet  ? cyclobenzaprine (FLEXERIL) 10 MG tablet  ? Chronic gout  ? Relevant Medications  ? colchicine 0.6 MG tablet  ? Centrilobular emphysema ([place])  ? Relevant Medications  ? albuterol (VENTOLIN HFA) 108 (90 Base) MCG/ACT inhaler  ? BREZTRI AEROSPHERE 160-9-4.8 MCG/ACT AERO  ? Other Relevant Orders  ? AMB Referral to [place]  ? ?GAD ?Chronic stable. ?PDMP reviewed ?Note off med 1 week some withdrawal symptoms. ?Re order Clonazepam 0.75mg  dose (1.5 tab of 0.5mg ) twice per day, added refills ? ?Gout, chronic ?Stable w/o flare ?Re order Allopurinol 100mg  x 2 = 200mg  daily prevention ?Add printed rx Colchicine PRN flare only ?  ?Centrilobular Emphysema / COPD ?Re order Albuterol PRN ?Prior LDCT reviewed ?Will refer you to our pharmacist Grayland Ormond she will call you to discuss Breztri financial assistance [place]&ME application. ?Sample x2 Breztri 2 puffs twice per day. ? ?Orders Placed This Encounter  ?Procedures  ? AMB Referral to [place]  ?  Referral Priority:   Routine  ?  Referral Type:   Consultation  ?  Referral Reason:   Care Coordination  ?  Number of Visits Requested:   1  ? ? ? ?Meds ordered this encounter  ?Medications  ? albuterol (VENTOLIN HFA) 108 (90 Base) MCG/ACT inhaler  ?  Sig: Inhale 1-2 puffs into the lungs every 6 (six) hours as needed for wheezing or shortness of breath.  ?  Dispense:  6.7 g  ?  Refill:  5  ? clonazePAM (KLONOPIN) 0.5 MG tablet   ?  Sig: TAKE 1 AND 1/2 TABLETS(0.75 MG) BY MOUTH TWICE DAILY  ?  Dispense:  90 tablet  ?  Refill:  5  ? colchicine 0.6 MG tablet  ?  Sig: TAKE 1 TABLET BY MOUTH EVERY DAY AS NEEDED FOR GOUT FLARE. REPEAT 1 PILL 2 HOURS LATER IF NEEDED. THEN CONTINUE DAILY FOR UP TO 3-5 DAYS AS NEEDED  ?  Dispense:  30 tablet  ?  Refill:  2  ? cyclobenzaprine (FLEXERIL) 10 MG tablet  ?  Sig: Take 1 tablet (10 mg total) by mouth at bedtime.  ?  Dispense:  30 tablet  ?  Refill:  5  ? BREZTRI AEROSPHERE 160-9-4.8 MCG/ACT AERO  ?  Sig: Inhale 2 puffs into the lungs 2 (two) times daily.  ?  Dispense:  10.7 g  ?  Refill:  0  ? ? ? ? ?Follow up plan: ?Return in about 6 months (around 08/24/2022) for 6 month follow-up Refills, updates, COPD. ? ?Nobie Putnam, DO ?[facility] ?[facility] ?02/21/2022, 4:07 PM ?

## 2022-02-21 NOTE — Patient Instructions (Addendum)
Thank you for coming to the office today. ? ?Medications refilled. ? ?Will refer you to our pharmacist Gentry Fitz she will call you to discuss Breztri financial assistance AZ&ME application. ? ?Breztri 2 puffs twice per day. ? ?Use the albuterol as needed, hope to reduce that in the future. ? ?Please schedule a Follow-up Appointment to: Return in about 6 months (around 08/24/2022) for 6 month follow-up Refills, updates, COPD. ? ?If you have any other questions or concerns, please feel free to call the office or send a message through MyChart. You may also schedule an earlier appointment if necessary. ? ?Additionally, you may be receiving a survey about your experience at our office within a few days to 1 week by e-mail or mail. We value your feedback. ? ?Saralyn Pilar, DO ?[facility], [place] ?

## 2022-02-22 ENCOUNTER — Telehealth: Payer: Self-pay

## 2022-02-22 NOTE — Chronic Care Management (AMB) (Signed)
  Care Management   Note  02/22/2022 Name: Vincent Dunn MRN: [id number] DOB: June 10, 1961  Vincent Dunn is a 61 y.o. year old male who is a primary care patient of Smitty Cords, DO. I reached out to Marshell Garfinkel by phone today offer care coordination services.   Vincent Dunn was given information about care management services today including:  Care management services include personalized support from designated clinical staff supervised by his physician, including individualized plan of care and coordination with other care providers 24/7 contact phone numbers for assistance for urgent and routine care needs. The patient may stop care management services at any time by phone call to the office staff.  Patient agreed to services and verbal consent obtained.   Follow up plan: Telephone appointment with care management team member scheduled for:02/23/2022  Penne Lash, RMA Care Guide, Embedded Care Coordination [facility]  Agency, [place] [address] Direct Dial: [phone] [email or web address]

## 2022-02-23 ENCOUNTER — Ambulatory Visit: Payer: Self-pay | Admitting: Pharmacist

## 2022-02-23 DIAGNOSIS — J432 Centrilobular emphysema: Secondary | ICD-10-CM

## 2022-02-23 NOTE — Patient Instructions (Signed)
Thank you allowing the Care Management Team to be a part of your care! It was a pleasure speaking with you today!     Care Management Team    Alto Denver RN, MSN, CCM Nurse Care Coordinator  [phone]   Estelle Grumbles, PharmD  Clinical Pharmacist  [phone]   Jenel Lucks, MSW, LCSW Clinical Social Worker [phone]   Visit Information   Goals Addressed             This Visit's Progress    CCM Expected Outcome:  Treat Symptoms of COPD, Reduces Flares       Pharmacy Goals       Please follow up with the [facility] at [phone].  Reminders for using your Breztri inhaler:  Please remember to prime your inhaler before using it for the first time. To do this, remove the cap from the mouthpiece, hold it upright, facing away from you, and shake well. Push the canister all the way down until it stops moving in the actuator to release a puff of medicine from the mouthpiece. You may hear a soft click from the dose indicator as it counts down. Repeat this process three times more.  To use the inhaler, take the cap off and shake the primed inhaler. Exhale as much as you reasonably can through your mouth, then close your lips around the mouthpiece and tilt your head back, keeping your tongue below the mouthpiece. Inhale deeply and slowly while pressing down on the canister until it stops moving. You should feel a puff of medicine. Remove the inhaler while holding your breath for as long as you comfortably can, up to 10 seconds, before breathing out gently. Repeat this process once more and replace the cap. You should be taking 2 puffs, 2 times a day. After your second puff, rinse your mouth with water to remove any excess medicine, making sure you don't swallow the water.  Thank you!  Estelle Grumbles, PharmD, [place], CPP Clinical Pharmacist [facility] [phone]          The patient verbalized understanding of  instructions, educational materials, and care plan provided today and DECLINED offer to receive copy of patient instructions, educational materials, and care plan.   Telephone follow up appointment with care management team member scheduled for: 03/09/2022 at 10:45 am

## 2022-02-23 NOTE — Chronic Care Management (AMB) (Signed)
Care Management   Pharmacy Note  02/23/2022 Name: Vincent Dunn MRN: [id number] DOB: 1961/03/09  Subjective: Vincent Dunn is a 61 y.o. year old male who is a primary care patient of Vincent Cords, DO. The Care Management team was consulted for assistance with care management and care coordination needs.    Engaged with patient by telephone for initial visit in response to provider referral for pharmacy case management and/or care coordination services.   The patient was given information about Care Management services today including:  Care Management services includes personalized support from designated clinical staff supervised by the patient's primary care provider, including individualized plan of care and coordination with other care providers. 24/7 contact phone numbers for assistance for urgent and routine care needs. The patient may stop case management services at any time by phone call to the office staff.  Patient agreed to services and consent obtained.  Assessment:  Review of patient status, including review of consultants reports, laboratory and other test data, was performed as part of comprehensive evaluation and provision of chronic care management services.   SDOH (Social Determinants of Health) assessments and interventions performed:    Objective:  Lab Results  Component Value Date   CREATININE 0.92 07/27/2020   CREATININE 0.87 08/06/2019   CREATININE 0.87 08/09/2012    Care Plan  No Known Allergies  Medications Reviewed Today     Reviewed by Vincent Dunn, RPH-CPP (Pharmacist) on 02/23/22 at [phone]  Med List Status: <None>   Medication Order Taking? Sig Documenting Provider Last Dose Status Informant  albuterol (VENTOLIN HFA) 108 (90 Base) MCG/ACT inhaler [id number] Yes Inhale 1-2 puffs into the lungs every 6 (six) hours as needed for wheezing or shortness of breath. Vincent Cords, DO Taking Active   allopurinol (ZYLOPRIM) 100 MG  tablet [id number] Yes TAKE 2 TABLETS(200 MG) BY MOUTH DAILY Vincent Cords, DO Taking Active   BREZTRI AEROSPHERE 160-9-4.8 MCG/ACT AERO [id number] Yes Inhale 2 puffs into the lungs 2 (two) times daily. Vincent Cords, DO Taking Active   clonazePAM (KLONOPIN) 0.5 MG tablet [id number] Yes TAKE 1 AND 1/2 TABLETS(0.75 MG) BY MOUTH TWICE DAILY Vincent Cords, DO Taking Active   colchicine 0.6 MG tablet [id number]  TAKE 1 TABLET BY MOUTH EVERY DAY AS NEEDED FOR GOUT FLARE. REPEAT 1 PILL 2 HOURS LATER IF NEEDED. THEN CONTINUE DAILY FOR UP TO 3-5 DAYS AS NEEDED Karamalegos, [name], DO  Active   cyclobenzaprine (FLEXERIL) 10 MG tablet [id number] Yes Take 1 tablet (10 mg total) by mouth at bedtime. Vincent Cords, DO Taking Active   levothyroxine (SYNTHROID) 200 MCG tablet [id number] Yes TAKE 1 TABLET(200 MCG) BY MOUTH DAILY BEFORE BREAKFAST Vincent Cords, DO Taking Active             Patient Active Problem List   Diagnosis Date Noted   Thoracic aortic aneurysm (HCC) 08/19/2020   Centrilobular emphysema (HCC) 08/03/2020   Chronic gout 02/10/2020   Mixed hyperlipidemia 08/13/2019   Elevated hemoglobin (HCC) 08/13/2019   Generalized anxiety disorder with panic attacks 07/15/2019   Acquired hypothyroidism 07/15/2019   Chronic pain of both shoulders 07/15/2019     Care Plan : PharmD- Medication Assistance  Updates made by Vincent Dunn, RPH-CPP since 02/23/2022 12:00 AM     Problem: Disease Progression      Long-Range Goal: Disease Progression Prevented or Minimized   Start Date: 02/23/2022  Expected End Date: 05/24/2022  This Visit's Progress: On track  Priority:  High  Note:   Current Barriers:  Unable to independently afford treatment regimen as currently uninsured  Pharmacist Clinical Goal:  Goal to use financial assistance through [facility] pharmacy help to get self pay patient access to brand name medication to reduce COPD flares.  Breztri through [place] Application  Interventions: 1:1 collaboration with Vincent Cords, DO regarding development and update of comprehensive plan of care as evidenced by provider attestation and co-signature Inter-disciplinary care team collaboration (see longitudinal plan of care) Comprehensive medication review performed; medication list updated in electronic medical record Caution patient that use of cyclobenzaprine with Breztri inhaler (which contains a anticholinergic agent) may increase his risk of anticholinergic side effects (urinary retention, constipation, dry mouth, etc). Patient to monitor and let provider/CM Pharmacist know if experiencing any of these side effects Caution patient for risk of dizziness/sedation with cyclobenzaprine and clonazepam, particularly when used in combination Patient denies sleepiness or dizziness  COPD: Current treatment: Breztri inhaler - 2 puffs twice daily (from sample from PCP) Albuterol inhaler - 1-2 puffs every 6 hours as needed Previous therapies tried: Trelegy Reports breathing has improved since started on Breztri inhaler Counsel patient on administration technique for [employer] inhaler, including importance of: holding his breath (~10 seconds) after each puff and rinsing out his mouth and spitting out after each use of inhaler  Medication Assistance: Patient currently uninsured and reports cost of his medications is difficult to afford Discuss medication assistance options including through [facility]) and patient assistance programs, such as for Breztri through [place] Based on lack of prescription coverage, age and reported household income, patient meets requirements for [facility] Provide contact number for [facility] Patient states that he will follow up with [facility] by phone today Send referral message to Willeen Niece with [facility]  Tobacco Use: Reports interested in quitting smoking and has recently cut back to smoking 2-4  cigarettes/day Motivation: breathing Strategies: chewing gum, focusing on motivation Discuss nicotine replacement therapy Encourage patient to follow up with [facility] [phone]) for support/assistance with obtaining nicotine replacement therapy  Patient Goals/Self-Care Activities patient will:  - collaborate with provider on medication access solutions      Plan: Telephone follow up appointment with care management team member scheduled for:  03/09/2022 at 10:45 am  Estelle Grumbles, PharmD, Patsy [place], CPP Clinical Pharmacist [facility] [phone]

## 2022-03-09 ENCOUNTER — Ambulatory Visit: Payer: Self-pay | Admitting: Pharmacist

## 2022-03-09 DIAGNOSIS — J432 Centrilobular emphysema: Secondary | ICD-10-CM

## 2022-03-09 NOTE — Chronic Care Management (AMB) (Signed)
Care Management   Pharmacy Note  03/09/2022 Name: Cottrell Gentles MRN: [id number] DOB: Jul 04, 1961  Subjective: Vincent Dunn is a 61 y.o. year old male who is a primary care patient of Smitty Cords, DO. The Care Management team was consulted for assistance with care management and care coordination needs.    Engaged with patient by telephone for follow up visit in response to provider referral for pharmacy case management and/or care coordination services.   The patient was given information about Care Management services today including:  Care Management services includes personalized support from designated clinical staff supervised by the patient's primary care provider, including individualized plan of care and coordination with other care providers. 24/7 contact phone numbers for assistance for urgent and routine care needs. The patient may stop case management services at any time by phone call to the office staff.  Patient agreed to services and consent obtained.  Assessment:  Review of patient status, including review of consultants reports, laboratory and other test data, was performed as part of comprehensive evaluation and provision of chronic care management services.   SDOH (Social Determinants of Health) assessments and interventions performed:    Objective:  Lab Results  Component Value Date   CREATININE 0.92 07/27/2020   CREATININE 0.87 08/06/2019   CREATININE 0.87 08/09/2012      Care Plan  No Known Allergies  Medications Reviewed Today     Reviewed by Manuela Neptune, RPH-CPP (Pharmacist) on 02/23/22 at [phone]  Med List Status: <None>   Medication Order Taking? Sig Documenting Provider Last Dose Status Informant  albuterol (VENTOLIN HFA) 108 (90 Base) MCG/ACT inhaler [id number] Yes Inhale 1-2 puffs into the lungs every 6 (six) hours as needed for wheezing or shortness of breath. Smitty Cords, DO Taking Active   allopurinol (ZYLOPRIM) 100  MG tablet [id number] Yes TAKE 2 TABLETS(200 MG) BY MOUTH DAILY Smitty Cords, DO Taking Active   BREZTRI AEROSPHERE 160-9-4.8 MCG/ACT AERO [id number] Yes Inhale 2 puffs into the lungs 2 (two) times daily. Smitty Cords, DO Taking Active   clonazePAM (KLONOPIN) 0.5 MG tablet [id number] Yes TAKE 1 AND 1/2 TABLETS(0.75 MG) BY MOUTH TWICE DAILY Smitty Cords, DO Taking Active   colchicine 0.6 MG tablet [id number]  TAKE 1 TABLET BY MOUTH EVERY DAY AS NEEDED FOR GOUT FLARE. REPEAT 1 PILL 2 HOURS LATER IF NEEDED. THEN CONTINUE DAILY FOR UP TO 3-5 DAYS AS NEEDED Karamalegos, [name], DO  Active   cyclobenzaprine (FLEXERIL) 10 MG tablet [id number] Yes Take 1 tablet (10 mg total) by mouth at bedtime. Smitty Cords, DO Taking Active   levothyroxine (SYNTHROID) 200 MCG tablet [id number] Yes TAKE 1 TABLET(200 MCG) BY MOUTH DAILY BEFORE BREAKFAST Althea Charon [name], DO Taking Active              Care Plan : PharmD- Medication Assistance  Updates made by Manuela Neptune, RPH-CPP since 03/09/2022 12:00 AM     Problem: Disease Progression      Long-Range Goal: Disease Progression Prevented or Minimized   Start Date: 02/23/2022  Expected End Date: 05/24/2022  Recent Progress: On track  Priority: High  Note:   Current Barriers:  Unable to independently afford treatment regimen as currently uninsured  Pharmacist Clinical Goal:  Goal to use financial assistance through [facility] pharmacy help to get self pay patient access to brand name medication to reduce COPD flares. Breztri through [place] Application  Interventions: 1:1 collaboration with Smitty Cords, DO regarding development and  update of comprehensive plan of care as evidenced by provider attestation and co-signature Inter-disciplinary care team collaboration (see longitudinal plan of care)  COPD: Current treatment: Breztri inhaler - 2 puffs twice daily (from sample from PCP) Albuterol  inhaler - 1-2 puffs every 6 hours as needed Previous therapies tried: Trelegy Have counseled patient on administration technique for Breztri inhaler, including importance of: holding his breath (~10 seconds) after each puff and rinsing out his mouth and spitting out after each use of inhaler  Medication Assistance: Patient currently uninsured and reports cost of his medications is difficult to afford Based on lack of prescription coverage, age and reported household income, patient meets requirements for [facility]) Sent referral message to Willeen Niece with [facility] on 5/19 Today patient reports contacted [facility] and left messages, but did not hear back Note that [facility]ication Management Clinic has now moved to location to the [facility] Outpatient pharmacy Advise patient to contact [facility] today and confirm patient has the correct phone number.  Patient confirms that he will call.   Patient Goals/Self-Care Activities patient will:  - collaborate with provider on medication access solutions       Plan: The care management team will reach out to the patient again over the next 14 days.  Estelle Grumbles, PharmD, Patsy [place], CPP Clinical Pharmacist [facility] [phone]

## 2022-03-09 NOTE — Patient Instructions (Signed)
Thank you allowing the Care Management Team to be a part of your care! It was a pleasure speaking with you today!     Care Management Team    Noreene Larsson RN, MSN, CCM Nurse Care Coordinator  [phone]   Wallace Cullens, PharmD  Clinical Pharmacist  [phone]   Christa See, MSW, LCSW Clinical Social Worker [phone]   Visit Information   Goals Addressed             This Visit's Progress    Pharmacy Goals       Please follow up with the [facility] Management Clinic at [phone].  Reminders for using your Breztri inhaler:  Please remember to prime your inhaler before using it for the first time. To do this, remove the cap from the mouthpiece, hold it upright, facing away from you, and shake well. Push the canister all the way down until it stops moving in the actuator to release a puff of medicine from the mouthpiece. You may hear a soft click from the dose indicator as it counts down. Repeat this process three times more.  To use the inhaler, take the cap off and shake the primed inhaler. Exhale as much as you reasonably can through your mouth, then close your lips around the mouthpiece and tilt your head back, keeping your tongue below the mouthpiece. Inhale deeply and slowly while pressing down on the canister until it stops moving. You should feel a puff of medicine. Remove the inhaler while holding your breath for as long as you comfortably can, up to 10 seconds, before breathing out gently. Repeat this process once more and replace the cap. You should be taking 2 puffs, 2 times a day. After your second puff, rinse your mouth with water to remove any excess medicine, making sure you don't swallow the water.  Thank you!  Wallace Cullens, PharmD, Para March, CPP Clinical Pharmacist [facility] [phone]          The patient verbalized understanding of instructions, educational materials, and care plan provided today and  DECLINED offer to receive copy of patient instructions, educational materials, and care plan.   The care management team will reach out to the patient again over the next 14 days.

## 2022-03-23 ENCOUNTER — Ambulatory Visit: Payer: Self-pay | Admitting: Pharmacist

## 2022-03-23 DIAGNOSIS — J432 Centrilobular emphysema: Secondary | ICD-10-CM

## 2022-03-23 NOTE — Chronic Care Management (AMB) (Signed)
Care Management   Pharmacy Note  03/23/2022 Name: Cashius Grandstaff MRN: [id number] DOB: 1961/04/09  Subjective: Gustabo Gordillo is a 61 y.o. year old male who is a primary care patient of Smitty Cords, DO. The Care Management team was consulted for assistance with care management and care coordination needs.    Engaged with patient by telephone for follow up visit in response to provider referral for pharmacy case management and/or care coordination services.   The patient was given information about Care Management services today including:  Care Management services includes personalized support from designated clinical staff supervised by the patient's primary care provider, including individualized plan of care and coordination with other care providers. 24/7 contact phone numbers for assistance for urgent and routine care needs. The patient may stop case management services at any time by phone call to the office staff.  Patient agreed to services and consent obtained.  Assessment:  Review of patient status, including review of consultants reports, laboratory and other test data, was performed as part of comprehensive evaluation and provision of chronic care management services.   SDOH (Social Determinants of Health) assessments and interventions performed:    Objective:   Care Plan  No Known Allergies  Medications Reviewed Today     Reviewed by Manuela Neptune, RPH-CPP (Pharmacist) on 02/23/22 at [phone]  Med List Status: <None>   Medication Order Taking? Sig Documenting Provider Last Dose Status Informant  albuterol (VENTOLIN HFA) 108 (90 Base) MCG/ACT inhaler [id number] Yes Inhale 1-2 puffs into the lungs every 6 (six) hours as needed for wheezing or shortness of breath. Smitty Cords, DO Taking Active   allopurinol (ZYLOPRIM) 100 MG tablet [id number] Yes TAKE 2 TABLETS(200 MG) BY MOUTH DAILY Smitty Cords, DO Taking Active   BREZTRI AEROSPHERE  160-9-4.8 MCG/ACT AERO [id number] Yes Inhale 2 puffs into the lungs 2 (two) times daily. Smitty Cords, DO Taking Active   clonazePAM (KLONOPIN) 0.5 MG tablet [id number] Yes TAKE 1 AND 1/2 TABLETS(0.75 MG) BY MOUTH TWICE DAILY Smitty Cords, DO Taking Active   colchicine 0.6 MG tablet [id number]  TAKE 1 TABLET BY MOUTH EVERY DAY AS NEEDED FOR GOUT FLARE. REPEAT 1 PILL 2 HOURS LATER IF NEEDED. THEN CONTINUE DAILY FOR UP TO 3-5 DAYS AS NEEDED Karamalegos, [name], DO  Active   cyclobenzaprine (FLEXERIL) 10 MG tablet [id number] Yes Take 1 tablet (10 mg total) by mouth at bedtime. Smitty Cords, DO Taking Active   levothyroxine (SYNTHROID) 200 MCG tablet [id number] Yes TAKE 1 TABLET(200 MCG) BY MOUTH DAILY BEFORE BREAKFAST Smitty Cords, DO Taking Active             Patient Active Problem List   Diagnosis Date Noted   Thoracic aortic aneurysm (HCC) 08/19/2020   Centrilobular emphysema (HCC) 08/03/2020   Chronic gout 02/10/2020   Mixed hyperlipidemia 08/13/2019   Elevated hemoglobin (HCC) 08/13/2019   Generalized anxiety disorder with panic attacks 07/15/2019   Acquired hypothyroidism 07/15/2019   Chronic pain of both shoulders 07/15/2019    Care Plan : PharmD- Medication Assistance  Updates made by Manuela Neptune, RPH-CPP since 03/23/2022 12:00 AM     Problem: Disease Progression      Long-Range Goal: Disease Progression Prevented or Minimized   Start Date: 02/23/2022  Expected End Date: 05/24/2022  Recent Progress: On track  Priority: High  Note:   Current Barriers:  Unable to independently afford treatment regimen as currently uninsured  Pharmacist Clinical Goal:  Goal to  use financial assistance through [facility] pharmacy help to get self pay patient access to brand name medication to reduce COPD flares. Breztri through [place] Application  Interventions: 1:1 collaboration with Smitty Cords, DO regarding development and  update of comprehensive plan of care as evidenced by provider attestation and co-signature Inter-disciplinary care team collaboration (see longitudinal plan of care)  Medication Assistance: Patient currently uninsured and reports cost of his medications is difficult to afford Based on lack of prescription coverage, age and reported household income, patient meets requirements for [facility]) Previously sent referral message to Willeen Niece with [facility] Today patient reports he has received messages from [facility], but has not successfully gotten in touch. Reports will try to contact [facility] again today to connect regarding enrollment in their assistance program   Patient Goals/Self-Care Activities patient will:  - collaborate with provider on medication access solutions       Plan:  1) Patient denies need to schedule further follow up with CM Pharmacist at this time The patient has been provided with contact information for CM Pharmacist and has been advised to call if needed for further medication questions/concerns  Estelle Grumbles, PharmD, Patsy [place], CPP Clinical Pharmacist [facility] [phone]

## 2022-03-23 NOTE — Patient Instructions (Signed)
Thank you allowing the Care Management Team to be a part of your care! It was a pleasure speaking with you today!     Care Management Team    Alto Denver RN, MSN, CCM Nurse Care Coordinator  [phone]   Estelle Grumbles, PharmD  Clinical Pharmacist  [phone]   Jenel Lucks, MSW, LCSW Clinical Social Worker [phone]   Visit Information   Goals Addressed             This Visit's Progress    Pharmacy Goals       Please follow up with the Medication Management Clinic at [phone].   Thank you!  Estelle Grumbles, PharmD, Patsy [place], CPP Clinical Pharmacist [facility] [phone]          The patient verbalized understanding of instructions, educational materials, and care plan provided today and DECLINED offer to receive copy of patient instructions, educational materials, and care plan.

## 2022-04-19 ENCOUNTER — Other Ambulatory Visit: Payer: Self-pay | Admitting: Family Medicine

## 2022-04-19 DIAGNOSIS — G8929 Other chronic pain: Secondary | ICD-10-CM

## 2022-04-19 NOTE — Telephone Encounter (Signed)
Requested medications are due for refill today.  no  Requested medications are on the active medications list.  yes  Last refill. 02/21/2022 #30 5 refills  Future visit scheduled.   no  Notes to clinic.  Refill not delegated.    Requested Prescriptions  Pending Prescriptions Disp Refills   cyclobenzaprine (FLEXERIL) 10 MG tablet [Pharmacy Med Name: CYCLOBENZAPRINE 10MG  TABLETS] 90 tablet     Sig: TAKE 1 TABLET(10 MG) BY MOUTH AT BEDTIME     Not Delegated - Analgesics:  Muscle Relaxants Failed - 04/19/2022 12:57 PM      Failed - This refill cannot be delegated      Passed - Valid encounter within last 6 months    Recent Outpatient Visits           1 month ago Centrilobular emphysema (HCC)   [facility], DO   1 year ago Acquired hypothyroidism   [facility], Althea Charon, DO   1 year ago Annual physical exam   [facility], DO   2 years ago Chronic pain of both shoulders   [facility] [place], [place]   2 years ago Chronic gout of right foot, unspecified cause   [facility] [place], [place], [place]

## 2022-07-18 ENCOUNTER — Other Ambulatory Visit: Payer: Self-pay | Admitting: Family Medicine

## 2022-07-18 DIAGNOSIS — E039 Hypothyroidism, unspecified: Secondary | ICD-10-CM

## 2022-07-18 DIAGNOSIS — M1A0710 Idiopathic chronic gout, right ankle and foot, without tophus (tophi): Secondary | ICD-10-CM

## 2022-07-18 NOTE — Telephone Encounter (Signed)
Requested Prescriptions  Pending Prescriptions Disp Refills  . levothyroxine (SYNTHROID) 200 MCG tablet [Pharmacy Med Name: LEVOTHYROXINE 0.2MG  (200MCG) TAB] 90 tablet 0    Sig: TAKE 1 TABLET(200 MCG) BY MOUTH DAILY BEFORE BREAKFAST     Endocrinology:  Hypothyroid Agents Failed - 07/18/2022  8:03 AM      Failed - TSH in normal range and within 360 days    TSH  Date Value Ref Range Status  01/26/2021 1.50 0.40 - 4.50 mIU/L Final         Passed - Valid encounter within last 12 months    Recent Outpatient Visits          4 months ago Centrilobular emphysema ([place])   [facility] Olin Hauser, DO   1 year ago Acquired hypothyroidism   [place], DO   1 year ago Annual physical exam   [place], DO   2 years ago Chronic pain of both shoulders   [place], DO   2 years ago Chronic gout of right foot, unspecified cause   [facility], Devonne Doughty, DO             . allopurinol (ZYLOPRIM) 100 MG tablet [Pharmacy Med Name: ALLOPURINOL 100MG  TABLETS] 180 tablet 0    Sig: TAKE 2 TABLETS(200 MG) BY MOUTH DAILY     Endocrinology:  Gout Agents - allopurinol Failed - 07/18/2022  8:03 AM      Failed - Uric Acid in normal range and within 360 days    Uric Acid, Serum  Date Value Ref Range Status  07/27/2020 6.1 4.0 - 8.0 mg/dL Final    Comment:    Therapeutic target for gout patients: <6.0 mg/dL .          Failed - Cr in normal range and within 360 days    Creat  Date Value Ref Range Status  07/27/2020 0.92 0.70 - 1.25 mg/dL Final    Comment:    For patients >65 years of age, the reference limit for Creatinine is approximately 13% higher for people identified as African-American. .          Failed - CBC within normal limits and completed in the last 12 months    WBC  Date Value Ref Range Status   07/27/2020 7.7 3.8 - 10.8 Thousand/uL Final   RBC  Date Value Ref Range Status  07/27/2020 5.03 4.20 - 5.80 Million/uL Final   Hemoglobin  Date Value Ref Range Status  07/27/2020 16.3 13.2 - 17.1 g/dL Final   HGB  Date Value Ref Range Status  08/09/2012 14.7 13.0 - 18.0 g/dL Final   HCT  Date Value Ref Range Status  07/27/2020 48.0 38.5 - 50.0 % Final  08/09/2012 43.4 40.0 - 52.0 % Final   MCHC  Date Value Ref Range Status  07/27/2020 34.0 32.0 - 36.0 g/dL Final   [facility]  Date Value Ref Range Status  07/27/2020 32.4 27.0 - 33.0 pg Final   MCV  Date Value Ref Range Status  07/27/2020 95.4 80.0 - 100.0 fL Final  08/09/2012 95 80 - 100 fL Final   No results found for: "PLTCOUNTKUC", "LABPLAT", "POCPLA" RDW  Date Value Ref Range Status  07/27/2020 13.5 11.0 - 15.0 % Final  08/09/2012 13.5 11.5 - 14.5 % Final         Passed - Valid encounter within last  12 months    Recent Outpatient Visits          4 months ago Centrilobular emphysema (H[facility])   [facility] Smitty Cords, DO   1 year ago Acquired hypothyroidism   [facility] Althea Charon, [name], DO   1 year ago Annual physical exam   [facility], [name], DO   2 years ago Chronic pain of both shoulders   [facility] [place], [name], DO   2 years ago Chronic gout of right foot, unspecified cause   [facility] [place], [name], [place]

## 2022-07-27 IMAGING — CT CT CHEST LUNG CANCER SCREENING LOW DOSE W/O CM
2 of 5 series · 15 of 40 positions shown, 18 images · non-contrast
Comparison: None.

CLINICAL DATA: Lung cancer screening. Forty-five pack-year history.
Current smoker, asymptomatic.

EXAM:
CT CHEST WITHOUT CONTRAST LOW-DOSE FOR LUNG CANCER SCREENING
TECHNIQUE: Multidetector CT imaging of the chest was performed following the
standard protocol without IV contrast.

[Series 3: lung 1.00 · axial · 0.70mm/px · z∈[-1230,-900]mm · 12 of 364 slices shown, 15 images]
[im 17/364  mediastinal]
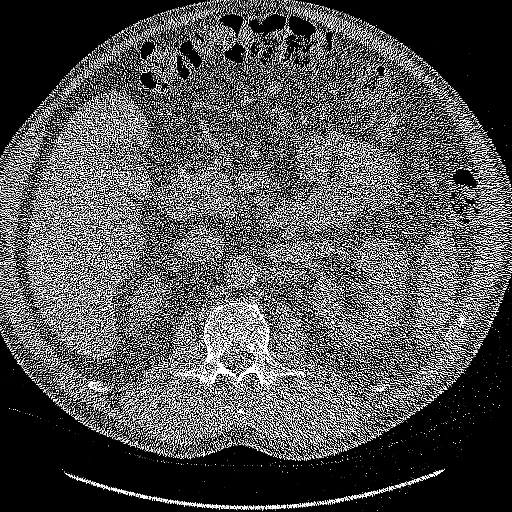
[im 17/364  lung]
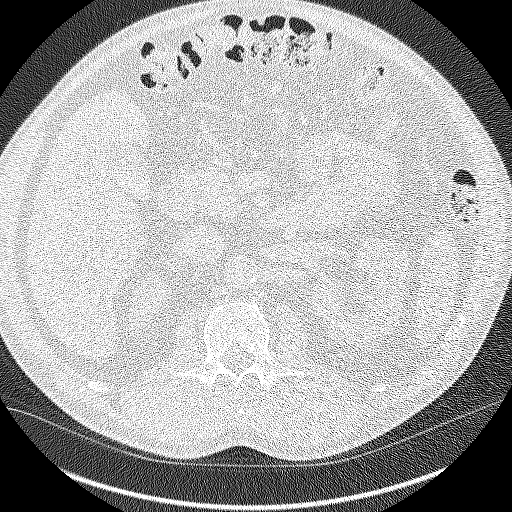
[im 50/364  lung]
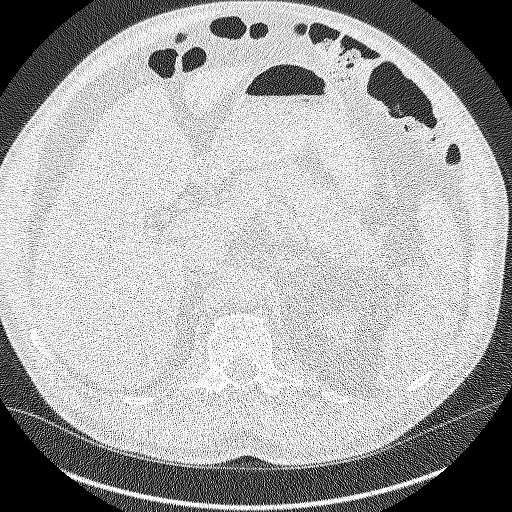
[im 83/364  lung]
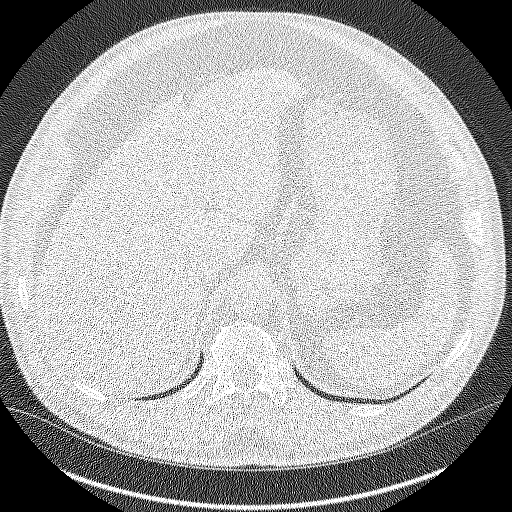
[im 116/364  lung]
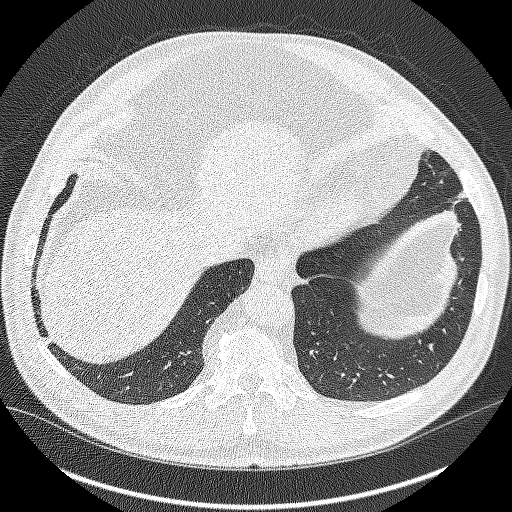
[im 133/364  mediastinal]
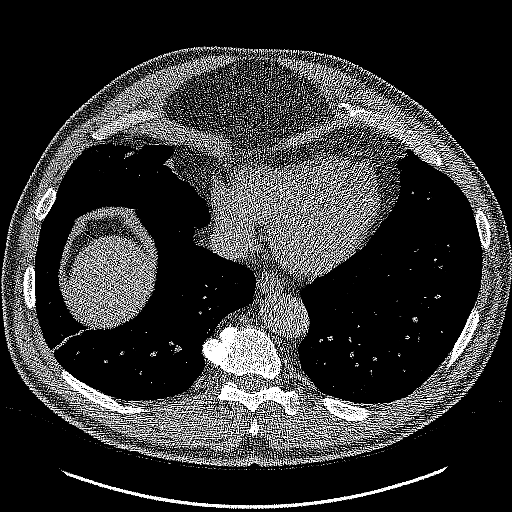
[im 133/364  lung]
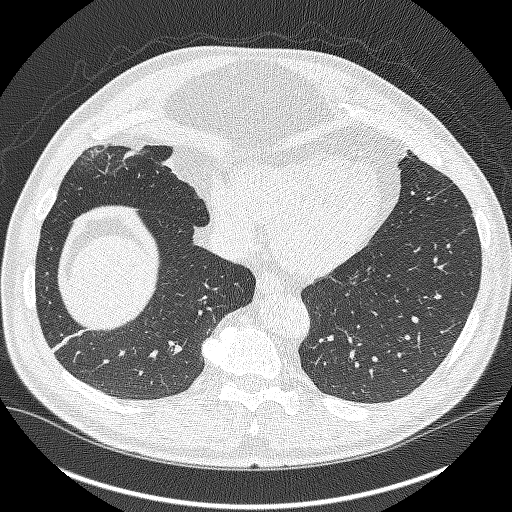
[im 166/364  lung]
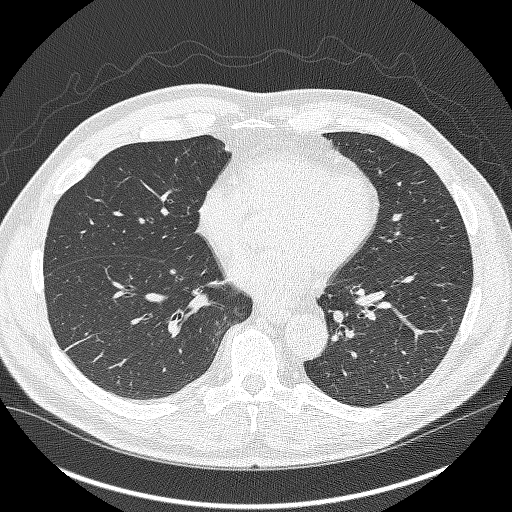
[im 199/364  lung]
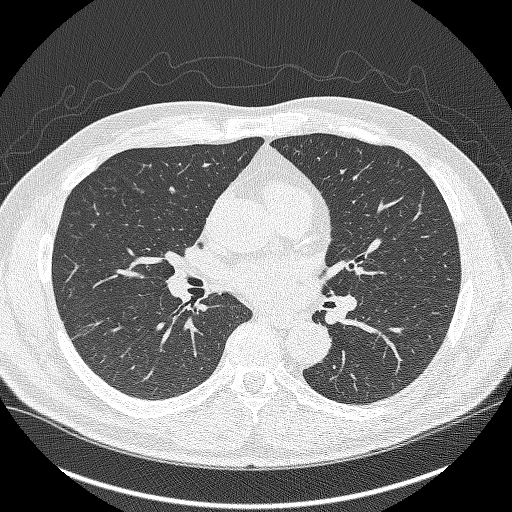
[im 232/364  lung]
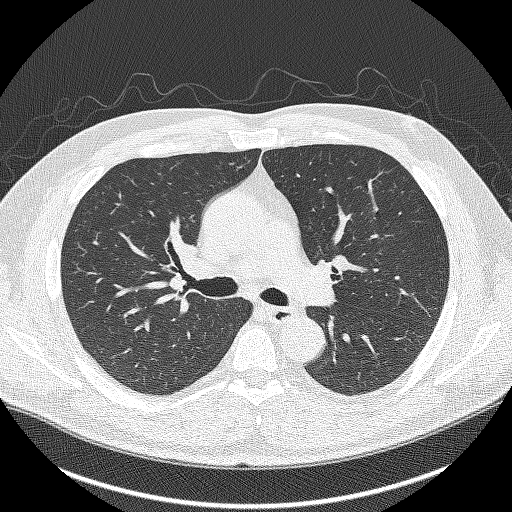
[im 248/364  mediastinal]
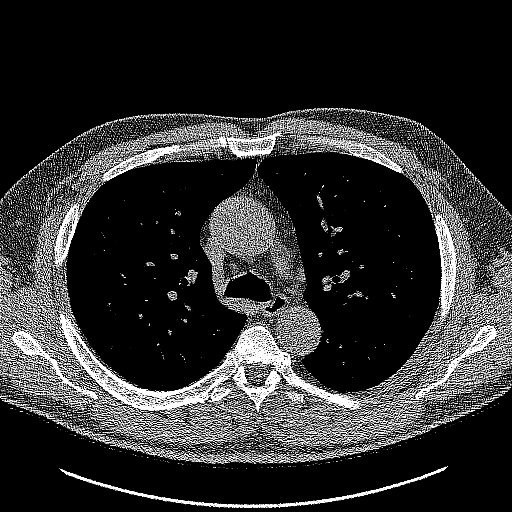
[im 248/364  lung]
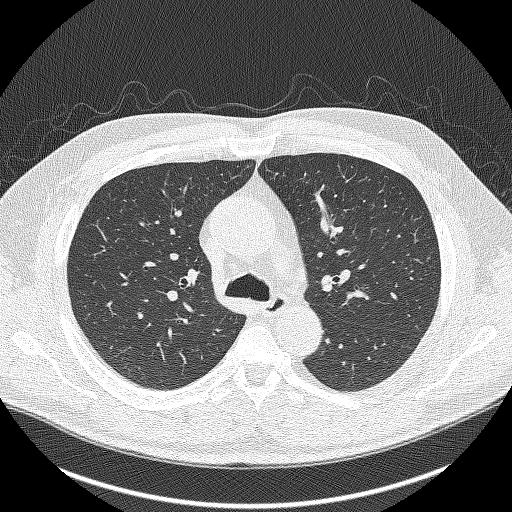
[im 281/364  lung]
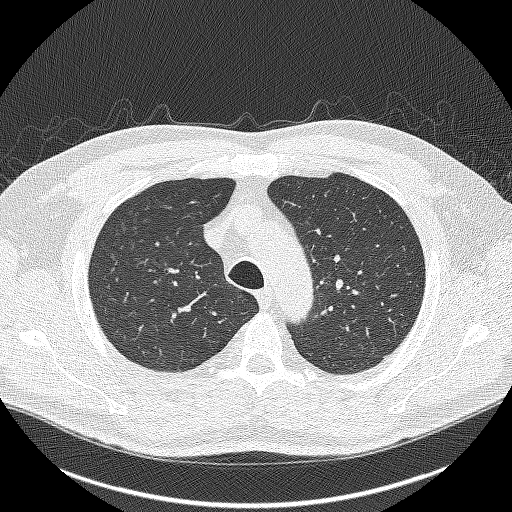
[im 314/364  lung]
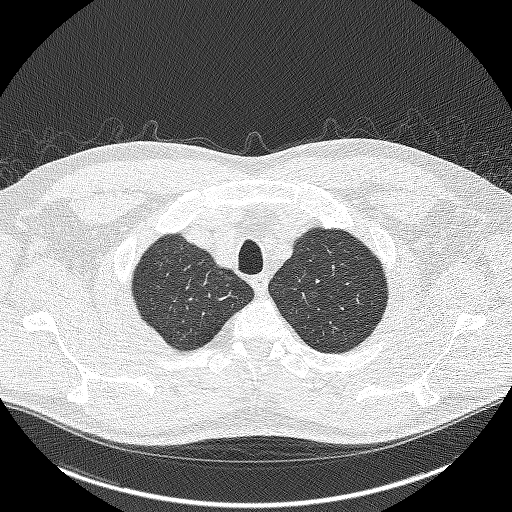
[im 347/364  lung]
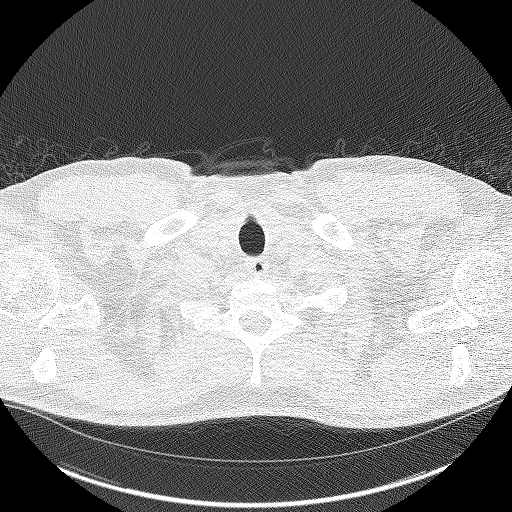

[Series 4: coronals lung 1.00 cor · coronal · 0.70mm/px · 3 of 269 slices shown]
[im 54/269  lung]
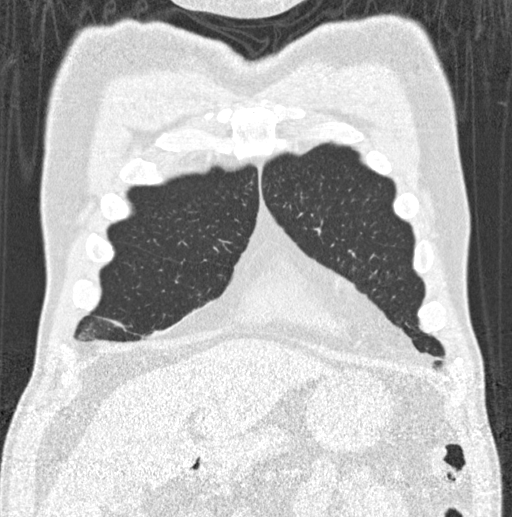
[im 108/269  lung]
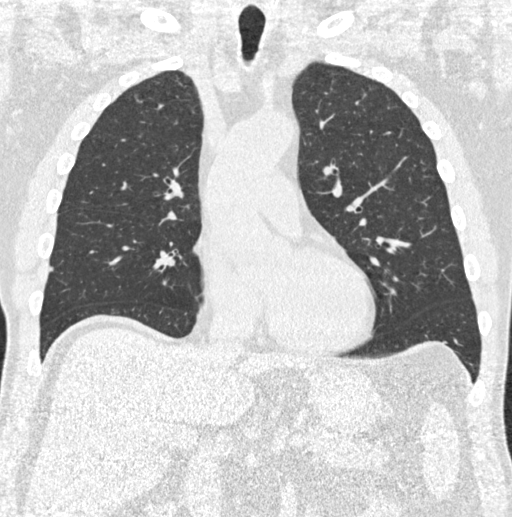
[im 161/269  lung]
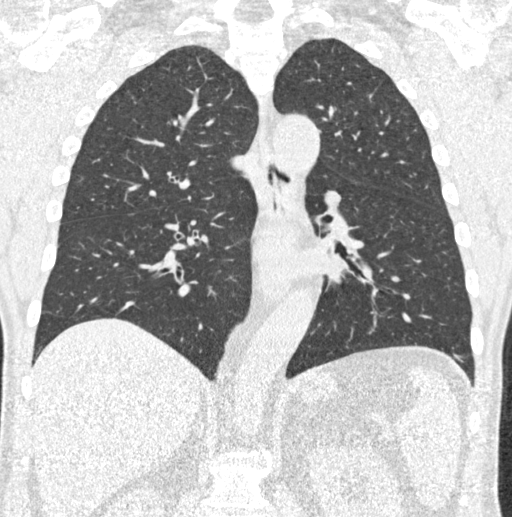

[15 of 40 positions shown; findings below may reference images not displayed]

FINDINGS: Cardiovascular: Mild aneurysmal dilatation of the ascending thoracic
aorta measures 4 cm. Mild aortic atherosclerosis and coronary artery
calcifications. The heart size is normal. No pericardial effusion.

Mediastinum/Nodes: No enlarged mediastinal, hilar, or axillary lymph
nodes. Thyroid gland, trachea, and esophagus demonstrate no
significant findings.

Lungs/Pleura: No pleural effusion, airspace consolidation or
atelectasis. Parenchymal bands identified within the right middle
lobe, lingula and both lung bases compatible with postinflammatory
scarring. Diffuse bronchial wall thickening and mild centrilobular
emphysema noted. Small pulmonary nodules are identified. The largest
is in the subpleural aspect of the right middle lobe measuring
mm.

Upper Abdomen: Tiny stones are identified within the gallbladder.
1.1 cm lipoma identified within the distal duodenum, image 69/2.
Aortic atherosclerotic calcifications.

Musculoskeletal: No chest wall mass or suspicious bone lesions
identified.
IMPRESSION: 1. Lung-RADS 2, benign appearance or behavior. Continue annual
screening with low-dose chest CT without contrast in 12 months.
2. Diffuse bronchial wall thickening with emphysema, as above;
imaging findings suggestive of underlying COPD.
3. Mild ascending thoracic aortic aneurysm measuring 4 cm. Recommend
semi-annual imaging followup by CTA or MRA and referral to
cardiothoracic surgery if not already obtained. This recommendation
follows 8161 ACCF/AHA/AATS/ACR/ASA/SCA/MEDA/BELGICA/KALAKO/SIDRIM Guidelines
for the Diagnosis and Management of Patients With Thoracic Aortic
Disease. Circulation. 8161; 121: E266-e36. Aortic aneurysm NOS
(EMWGP-Q7A.B)
4. Coronary artery calcifications.
5. Gallstones.

Aortic Atherosclerosis (EMWGP-KUY.Y) and Emphysema (EMWGP-IOX.C).

## 2022-08-13 ENCOUNTER — Other Ambulatory Visit: Payer: Self-pay | Admitting: Family Medicine

## 2022-08-13 DIAGNOSIS — E039 Hypothyroidism, unspecified: Secondary | ICD-10-CM

## 2022-08-13 DIAGNOSIS — Z125 Encounter for screening for malignant neoplasm of prostate: Secondary | ICD-10-CM

## 2022-08-13 DIAGNOSIS — M1A0710 Idiopathic chronic gout, right ankle and foot, without tophus (tophi): Secondary | ICD-10-CM

## 2022-08-13 DIAGNOSIS — E782 Mixed hyperlipidemia: Secondary | ICD-10-CM

## 2022-08-13 DIAGNOSIS — F410 Panic disorder [episodic paroxysmal anxiety] without agoraphobia: Secondary | ICD-10-CM

## 2022-08-13 DIAGNOSIS — R7309 Other abnormal glucose: Secondary | ICD-10-CM

## 2022-08-13 MED ORDER — ALLOPURINOL 100 MG PO TABS: 200.0000 mg | ORAL_TABLET | Freq: Every day | ORAL | 3 refills | Status: DC

## 2022-08-13 MED ORDER — CLONAZEPAM 0.5 MG PO TABS: ORAL_TABLET | ORAL | 5 refills | Status: DC

## 2022-08-13 MED ORDER — LEVOTHYROXINE SODIUM 200 MCG PO TABS: 200.0000 ug | ORAL_TABLET | Freq: Every day | ORAL | 3 refills | Status: DC

## 2022-08-13 NOTE — Telephone Encounter (Signed)
Please notify patient that he should go ahead and schedule next available fasting lab visit and then annual physical about a week later.  I have refilled meds and placed all lab orders.  Nobie Putnam, DO [facility] 08/13/2022, 5:46 PM

## 2022-08-13 NOTE — Telephone Encounter (Signed)
Medication Refill - Medication: clonazePAM (KLONOPIN) 0.5 MG tablet  levothyroxine (SYNTHROID) 200 MCG tablet  allopurinol (ZYLOPRIM) 100 MG tablet  Has the patient contacted their pharmacy? Yes.   (Agent: If no, request that the patient contact the pharmacy for the refill. If patient does not wish to contact the pharmacy document the reason why and proceed with request.) (Agent: If yes, when and what did the pharmacy advise?)  Preferred Pharmacy (with phone number or street name):  [facility], [place] [address] AT [facility]  [place] [address]  Phone: [phone] Fax: [phone]   Has the patient been seen for an appointment in the last year OR does the patient have an upcoming appointment? Yes.    Agent: Please be advised that RX refills may take up to 3 business days. We ask that you follow-up with your pharmacy.

## 2022-08-13 NOTE — Telephone Encounter (Signed)
Copied from [place] [id number]. Topic: Appointment Scheduling - Scheduling Inquiry for Clinic >> Aug 13, 2022  9:50 AM Sabas Sous wrote: Reason for CRM: Patient wants to have lab work prior to appt, wants full panel.  Best contact:[phone]

## 2022-12-07 ENCOUNTER — Other Ambulatory Visit: Payer: Self-pay

## 2022-12-07 ENCOUNTER — Emergency Department: Payer: Self-pay

## 2022-12-07 ENCOUNTER — Emergency Department
Admission: EM | Admit: 2022-12-07 | Discharge: 2022-12-07 | Disposition: A | Discharge status: Home / Self Care | Payer: Self-pay | Attending: Emergency Medicine | Admitting: Emergency Medicine

## 2022-12-07 DIAGNOSIS — R079 Chest pain, unspecified: Secondary | ICD-10-CM | POA: Insufficient documentation

## 2022-12-07 LAB — TROPONIN I (HIGH SENSITIVITY)
Troponin I (High Sensitivity): 3 ng/L (ref <18)
Troponin I (High Sensitivity): 2 ng/L (ref <18)

## 2022-12-07 LAB — CBC
HCT: 53.9 % — ABNORMAL HIGH (ref 39.0–52.0)
Hemoglobin: 18.2 g/dL — ABNORMAL HIGH (ref 13.0–17.0)
MCH: 32.6 pg (ref 26.0–34.0)
MCHC: 33.8 g/dL (ref 30.0–36.0)
MCV: 96.4 fL (ref 80.0–100.0)
Platelets: 264 10*3/uL (ref 150–400)
RBC: 5.59 MIL/uL (ref 4.22–5.81)
RDW: 13.2 % (ref 11.5–15.5)
WBC: 7 10*3/uL (ref 4.0–10.5)
nRBC: 0 % (ref 0.0–0.2)

## 2022-12-07 LAB — BASIC METABOLIC PANEL
Anion gap: 9 (ref 5–15)
BUN: 10 mg/dL (ref 8–23)
CO2: 22 mmol/L (ref 22–32)
Calcium: 8.6 mg/dL — ABNORMAL LOW (ref 8.9–10.3)
Chloride: 108 mmol/L (ref 98–111)
Creatinine, Ser: 0.73 mg/dL (ref 0.61–1.24)
GFR, Estimated: >60 mL/min (ref >60)
Glucose, Bld: 103 mg/dL — ABNORMAL HIGH (ref 70–99)
Potassium: 3.9 mmol/L (ref 3.5–5.1)
Sodium: 139 mmol/L (ref 135–145)

## 2022-12-07 MED ORDER — IOHEXOL 350 MG/ML SOLN
125.0000 mL | Freq: Once | INTRAVENOUS | Status: AC | PRN
  Administered 2022-12-07: 125 mL via INTRAVENOUS

## 2022-12-07 MED ORDER — KETOROLAC TROMETHAMINE 15 MG/ML IJ SOLN
15.0000 mg | Freq: Once | INTRAMUSCULAR | Status: AC
  Administered 2022-12-07: 15 mg via INTRAVENOUS
  Filled 2022-12-07: qty 1

## 2022-12-07 NOTE — ED Notes (Signed)
Pt verbalizes understanding of discharge instructions. Opportunity for questioning and answers were provided. Pt discharged from ED to home.   ? ?

## 2022-12-07 NOTE — Discharge Instructions (Signed)
Your lab tests and CT scan today did not show any immediate issues.  Your Thoracic Aortic Aneurysm is 4.1cm big and needs to be monitored yearly. The scan also found an area of your colon that is suspicious for developing colon cancer and needs to be evaluated with colonoscopy.  CT summary: IMPRESSION:  VASCULAR:    1. No evidence of thoracoabdominal aortic dissection.  2. Unchanged mild aneurysmal dilatation of the ascending thoracic  aorta measuring up to 4.1 cm in diameter. Recommend annual imaging  followup by CTA or MRA. This recommendation follows 2010  ACCF/AHA/AATS/ACR/ASA/SCA/SCAI/SIR/STS/SVM Guidelines for the  Diagnosis and Management of Patients with Thoracic Aortic Disease.  Circulation. 2010; [id number]. Aortic aneurysm NOS (ICD10-I71.9)  3.  Aortic atherosclerosis (ICD10-I70.0).    CHEST:    1. No acute intrathoracic process.    ABDOMEN AND PELVIS:    1. No acute intra-abdominal process.  2. Focal asymmetric mass-like wall thickening of the proximal to mid  sigmoid colon, concerning for primary colonic neoplasm. Colonoscopy  is recommended for further evaluation.  3. Unchanged cholelithiasis.

## 2022-12-07 NOTE — ED Triage Notes (Signed)
Pt here with cp that started yesterday afternoon. Pt states pain is right sided and radiates to his left arm. Pt states he has a hx of a blockage and an aneurysm. Pt states pain is tight in nature.

## 2022-12-07 NOTE — ED Provider Notes (Signed)
[facility] Provider Note    Event Date/Time   First MD Initiated Contact with Patient 12/07/22 1242     (approximate)   History   Chief Complaint: Chest Pain   HPI  Vincent Dunn is a 62 y.o. male with a history of emphysema, thoracic aortic aneurysm, anxiety who comes ED complaining of chest pain that started yesterday afternoon, waxing and waning, currently mild.  Radiates to the left arm.  Not exertional, not pleuritic.  No shortness of breath diaphoresis or vomiting.  No paresthesias or motor weakness.  No neck pain or headache or syncope.  Has not had follow-up for his thoracic aortic aneurysm in several years due to lack of insurance.  Works in a warehouse, moving and lifting heavy objects routinely.     Physical Exam   Triage Vital Signs: ED Triage Vitals  Enc Vitals Group     BP 12/07/22 1105 (!) 171/100     Pulse Rate 12/07/22 1105 73     Resp 12/07/22 1105 (!) 22     Temp 12/07/22 1105 (!) 97.4 F (36.3 C)     Temp Source 12/07/22 1105 Oral     SpO2 12/07/22 1105 96 %     Weight 12/07/22 1106 191 lb 2.2 oz (86.7 kg)     Height 12/07/22 1106 '5\' 10"'$  (1.778 m)     Head Circumference --      Peak Flow --      Pain Score 12/07/22 1106 7     Pain Loc --      Pain Edu? --      Excl. in [place]? --     Most recent vital signs: Vitals:   12/07/22 1245 12/07/22 1330  BP: (!) 152/98 (!) 144/89  Pulse: (!) 57 64  Resp: 14 18  Temp:    SpO2: 97% 95%    General: Awake, no distress.  CV:  Good peripheral perfusion.  Symmetric distal pulses.  Regular rate and rhythm Resp:  Normal effort.  Clear to auscultation bilaterally Abd:  No distention.  Soft nontender Other:  No lower extremity swelling or calf tenderness.  The left lateral chest wall has an area of tenderness which somewhat reproduces the patient's pain.   ED Results / Procedures / Treatments   Labs (all labs ordered are listed, but only abnormal results are displayed) Labs Reviewed   BASIC METABOLIC PANEL - Abnormal; Notable for the following components:      Result Value   Glucose, Bld 103 (*)    Calcium 8.6 (*)    All other components within normal limits  CBC - Abnormal; Notable for the following components:   Hemoglobin 18.2 (*)    HCT 53.9 (*)    All other components within normal limits  TROPONIN I (HIGH SENSITIVITY)  TROPONIN I (HIGH SENSITIVITY)     EKG Interpreted by me Normal sinus rhythm rate of 75.  Normal axis intervals QRS ST segments and T waves.   RADIOLOGY Chest x-ray interpreted by me, appears normal.  Radiology report reviewed.  CT angiogram chest abdomen pelvis negative for dissection or aortic rupture.  Thoracic aortic aneurysm measuring 4.1 cm.  Also finding of area of the large intestine suspicious for colon cancer   PROCEDURES:  Procedures   MEDICATIONS ORDERED IN ED: Medications  ketorolac (TORADOL) 15 MG/ML injection 15 mg (15 mg Intravenous Given 12/07/22 1310)  iohexol (OMNIPAQUE) 350 MG/ML injection 125 mL (125 mLs Intravenous Contrast Given 12/07/22 1334)  IMPRESSION / MDM / ASSESSMENT AND PLAN / ED COURSE  I reviewed the triage vital signs and the nursing notes.  DDx: Chest wall strain, aortic dissection, symptomatic aortic aneurysm with possible rupture, pneumothorax, pneumonia, pleural effusion, pericardial effusion, non-STEMI  Patient's presentation is most consistent with acute presentation with potential threat to life or bodily function.  Patient presents with atypical chest pain with history of aortic aneurysm.  Most likely this is musculoskeletal.  Labs including serial troponins, chest x-ray, CT angiogram all unremarkable except for chronic findings as detailed above.  Patient informed of these findings and need for follow-up with vascular surgery and gastroenterology.       FINAL CLINICAL IMPRESSION(S) / ED DIAGNOSES   Final diagnoses:  Nonspecific chest pain     Rx / DC Orders   ED Discharge  Orders     None        Note:  This document was prepared using Dragon voice recognition software and may include unintentional dictation errors.   Carrie Mew, MD 12/07/22 [id number]

## 2022-12-10 ENCOUNTER — Telehealth: Payer: Self-pay

## 2022-12-10 NOTE — Transitions of Care (Post Inpatient/ED Visit) (Unsigned)
   12/10/2022  Name: Vincent Dunn MRN: [id number] DOB: 06-19-1961  Today's TOC FU Call Status: Today's TOC FU Call Status:: Unsuccessul Call (1st Attempt) Unsuccessful Call (1st Attempt) Date: 12/10/22  Attempted to reach the patient regarding the most recent Inpatient/ED visit.  Follow Up Plan: Additional outreach attempts will be made to reach the patient to complete the Transitions of Care (Post Inpatient/ED visit) call.   [name], CMA(AAMA)

## 2022-12-11 NOTE — Transitions of Care (Post Inpatient/ED Visit) (Unsigned)
   12/11/2022  Name: Gentle Bogle MRN: [id number] DOB: Oct 29, 1960  Today's TOC FU Call Status: Today's TOC FU Call Status:: Unsuccessful Call (2nd Attempt) Unsuccessful Call (1st Attempt) Date: 12/10/22 Unsuccessful Call (2nd Attempt) Date: 12/11/22  Attempted to reach the patient regarding the most recent Inpatient/ED visit.  Follow Up Plan: Additional outreach attempts will be made to reach the patient to complete the Transitions of Care (Post Inpatient/ED visit) call.   [name] [facility])

## 2022-12-12 NOTE — Transitions of Care (Post Inpatient/ED Visit) (Signed)
   12/12/2022  Name: Vincent Dunn MRN: [id number] DOB: 01/06/1961  Today's TOC FU Call Status: Today's TOC FU Call Status:: Unsuccessful Call (3rd Attempt) Unsuccessful Call (1st Attempt) Date: 12/10/22 Unsuccessful Call (2nd Attempt) Date: 12/11/22 Unsuccessful Call (3rd Attempt) Date: 12/12/22  Attempted to reach the patient regarding the most recent Inpatient/ED visit.  Follow Up Plan: No further outreach attempts will be made at this time. We have been unable to contact the patient.   [name], [facility]) [facility] [phone]

## 2022-12-31 ENCOUNTER — Telehealth: Payer: Self-pay | Admitting: Family Medicine

## 2022-12-31 DIAGNOSIS — K6389 Other specified diseases of intestine: Secondary | ICD-10-CM

## 2022-12-31 DIAGNOSIS — R933 Abnormal findings on diagnostic imaging of other parts of digestive tract: Secondary | ICD-10-CM

## 2022-12-31 NOTE — Telephone Encounter (Signed)
Please notify patient.  Referral has been sent to [facility] for scheduling Colonoscopy due to this abnormal finding on CT.   Reason for Referral: referral to return to GI for colonoscopy after abnormal CT Abdomen showed: Focal asymmetric mass-like wall thickening of the proximal to mid sigmoid colon, concerning for primary colonic neoplasm. Colonoscopy is recommended for further evaluation.    Has the referral been discussed with the patient?: yes   Designated contact for the referral if not the patient (name/phone number): patient   Has the patient seen a specialist for this issue before?: Yes   If so, who (practice/provider)? [facility] colonoscopy   Does the patient have a provider or location preference for the referral?: Yes - [facility]  Would the patient like to see previous specialist if applicable?   Nobie Putnam, [facility] 12/31/2022, 5:30 PM

## 2022-12-31 NOTE — Telephone Encounter (Signed)
Referral Request - Has patient seen PCP for this complaint? no *If NO, is insurance requiring patient see PCP for this issue before PCP can refer them? Referral for which specialty: colonoscopy Preferred provider/office: ? Reason for referral: Er Dr found some suspicious areas in colon

## 2023-01-01 NOTE — Telephone Encounter (Signed)
I have sent him a MyChart message letting him know that a referral was sent and that their office will contact him to make an appointment.

## 2023-01-08 ENCOUNTER — Telehealth: Payer: Self-pay

## 2023-01-08 NOTE — Telephone Encounter (Signed)
Copied from [place] [phone]. Topic: Referral - Question >> Jan 08, 2023  2:03 PM Erskine Squibb wrote: Reason for CRM: Tabitha with [facility] called to let the provider know that the patients insurance just changed to a [facility] based [place] and they do not accept that. Please assist patient further as this is a brand new insurance that needs to go through [facility].

## 2023-01-08 NOTE — Telephone Encounter (Signed)
Lexine Baton, can you follow up on this request to change referral now to [facility] Based practice? Most likely this would be [facility] out of [place] or [place] / [place].  Let me know if you need a new order. Thanks!  Nobie Putnam, [facility] 01/08/2023, 5:51 PM

## 2023-02-11 ENCOUNTER — Other Ambulatory Visit: Payer: Self-pay | Admitting: Family Medicine

## 2023-02-11 DIAGNOSIS — F410 Panic disorder [episodic paroxysmal anxiety] without agoraphobia: Secondary | ICD-10-CM

## 2023-02-11 DIAGNOSIS — F411 Generalized anxiety disorder: Secondary | ICD-10-CM

## 2023-02-11 NOTE — Telephone Encounter (Signed)
Medication Refill - Medication: Clonazepam 0.5 mg  Has the patient contacted their pharmacy? Yes.  They told him to call the office  (referred Pharmacy (with phone number or street name): [facility]  [address] Has the patient been seen for an appointment in the last year OR does the patient have an upcoming appointment? yes  Agent: Please be advised that RX refills may take up to 3 business days. We ask that you follow-up with your pharmacy.

## 2023-02-12 MED ORDER — CLONAZEPAM 0.5 MG PO TABS: ORAL_TABLET | ORAL | 0 refills | Status: DC

## 2023-02-12 NOTE — Telephone Encounter (Signed)
Requested medications are due for refill today.  yes  Requested medications are on the active medications list.  yes  Last refill. 08/13/2022 #90 5 rf  Future visit scheduled.   no  Notes to clinic.  Refill not delegated.    Requested Prescriptions  Pending Prescriptions Disp Refills   clonazePAM (KLONOPIN) 0.5 MG tablet 90 tablet 5    Sig: TAKE 1 AND 1/2 TABLETS(0.75 MG) BY MOUTH TWICE DAILY     Not Delegated - Psychiatry: Anxiolytics/Hypnotics 2 Failed - 02/11/2023 10:24 AM      Failed - This refill cannot be delegated      Failed - Urine Drug Screen completed in last 360 days      Failed - Valid encounter within last 6 months    Recent Outpatient Visits           11 months ago Centrilobular emphysema [facility])   [place] [facility] Smitty Cords, DO   2 years ago Acquired hypothyroidism   [place] [facility] Smitty Cords, DO   2 years ago Annual physical exam   [place] [facility] Smitty Cords, DO   2 years ago Chronic pain of both shoulders   [place] [facility] [place], [name], DO   3 years ago Chronic gout of right foot, unspecified cause    [facility] [place], [name], [place] - Patient is not pregnant

## 2023-04-02 ENCOUNTER — Other Ambulatory Visit: Payer: Self-pay | Admitting: Family Medicine

## 2023-04-02 DIAGNOSIS — G8929 Other chronic pain: Secondary | ICD-10-CM

## 2023-04-02 DIAGNOSIS — F410 Panic disorder [episodic paroxysmal anxiety] without agoraphobia: Secondary | ICD-10-CM

## 2023-04-02 NOTE — Telephone Encounter (Signed)
  Recent Vital Signs   @VS @   Past Medical History:  Diagnosis Date   Anxiety    Thyroid disease      Expected Discharge Date  @FLOW (161096::0)@  Diet Order     None        VTE Documentation  @FLOW (4540981::1)@   Work Intensity Score/Level of Care  @FLOW (10536::1)@  @LEVELOFCARE @   Mobility  @FLOW (7060220::1)@     Significant Events    DC Barriers   Abnormal Labs:  Otis Brace 04/02/2023, 9:50 AM   Has the patient contacted their pharmacy? Yes.   (Agent: If no, request that the patient contact the pharmacy for the refill. If patient does not wish to contact the pharmacy document the reason why and proceed with request.) (Agent: If yes, when and what did the pharmacy advise?)  Preferred Pharmacy (with phone number or street name): [facility] [place] Has the patient been seen for an appointment in the last year OR does the patient have an upcoming appointment? Yes.    Agent: Please be advised that RX refills may take up to 3 business days. We ask that you follow-up with your pharmacy.

## 2023-04-02 NOTE — Telephone Encounter (Signed)
Medication Refill - Medication: Klonopin , flexeril 10 mg  Has the patient contacted their pharmacy? yes (Agent: If no, request that the patient contact the pharmacy for the refill. If patient does not wish to contact the pharmacy document the reason why and proceed with request.) (Agent: If yes, when and what did the pharmacy advise?)  Preferred Pharmacy (with phone number or street name): [facility] [place] Has the patient been seen for an appointment in the last year OR does the patient have an upcoming appointment? Yes.    Agent: Please be advised that RX refills may take up to 3 business days. We ask that you follow-up with your pharmacy.

## 2023-04-03 MED ORDER — CYCLOBENZAPRINE HCL 10 MG PO TABS: 10.0000 mg | ORAL_TABLET | Freq: Every day | ORAL | 1 refills | Status: DC

## 2023-04-03 MED ORDER — CLONAZEPAM 0.5 MG PO TABS: ORAL_TABLET | ORAL | 2 refills | Status: DC

## 2023-04-03 NOTE — Telephone Encounter (Signed)
Requested medication (s) are due for refill today: yes to both   Requested medication (s) are on the active medication list:yes  Last refill:  clonazepam: 02/12/23 #90     cyclobenzaprine: 04/19/22 #90 1 RF  Future visit scheduled: yes  Notes to clinic:  neither med delegated to NT to RF   Requested Prescriptions  Pending Prescriptions Disp Refills   clonazePAM (KLONOPIN) 0.5 MG tablet 90 tablet 0    Sig: TAKE 1 AND 1/2 TABLETS(0.75 MG) BY MOUTH TWICE DAILY     Not Delegated - Psychiatry: Anxiolytics/Hypnotics 2 Failed - 04/02/2023 11:06 AM      Failed - This refill cannot be delegated      Failed - Urine Drug Screen completed in last 360 days      Failed - Valid encounter within last 6 months    Recent Outpatient Visits           1 year ago Centrilobular emphysema [facility])   [facility] [place], [name], DO   2 years ago Acquired hypothyroidism   [place] [facility] Smitty Cords, DO   2 years ago Annual physical exam   [place] [facility] Smitty Cords, DO   3 years ago Chronic pain of both shoulders   [place] [facility] [place], [name], DO   3 years ago Chronic gout of right foot, unspecified cause   [place] [facility] Althea Charon, [name], DO       Future Appointments             In 1 week Althea Charon, [name], DO [place] [facility]            Passed - Patient is not pregnant       cyclobenzaprine (FLEXERIL) 10 MG tablet 90 tablet 1     Not Delegated - Analgesics:  Muscle Relaxants Failed - 04/02/2023 11:06 AM      Failed - This refill cannot be delegated      Failed - Valid encounter within last 6 months    Recent Outpatient Visits           1 year ago Centrilobular emphysema [facility])   [facility] [place], [name], DO   2 years ago Acquired  hypothyroidism   [place] [facility] Althea Charon, [name], DO   2 years ago Annual physical exam   [place] [facility] [place], [name], DO   3 years ago Chronic pain of both shoulders   [place] [facility] [place], [name], DO   3 years ago Chronic gout of right foot, unspecified cause    [facility] [place], [name], DO       Future Appointments             In 1 week Althea Charon, [name], DO  [facility], [facility]

## 2023-04-15 ENCOUNTER — Encounter: Payer: Self-pay | Admitting: Family Medicine

## 2023-04-15 ENCOUNTER — Ambulatory Visit: Payer: Self-pay | Admitting: Family Medicine

## 2023-04-15 VITALS — BP 140/72 | HR 68 | Resp 18 | Ht 70.0 in | Wt 196.8 lb

## 2023-04-15 DIAGNOSIS — R933 Abnormal findings on diagnostic imaging of other parts of digestive tract: Secondary | ICD-10-CM

## 2023-04-15 DIAGNOSIS — J432 Centrilobular emphysema: Secondary | ICD-10-CM

## 2023-04-15 DIAGNOSIS — F410 Panic disorder [episodic paroxysmal anxiety] without agoraphobia: Secondary | ICD-10-CM

## 2023-04-15 DIAGNOSIS — F411 Generalized anxiety disorder: Secondary | ICD-10-CM

## 2023-04-15 DIAGNOSIS — K6389 Other specified diseases of intestine: Secondary | ICD-10-CM

## 2023-04-15 NOTE — Patient Instructions (Addendum)
Thank you for coming to the office today.  New Urgent Referral to GI for Colonoscopy. Will be at a [facility]  Referral sent today. If you don't hear back by Wednesday, please contact us again call or message  2 sample inhalers  1 Breztri - 2 puff twice a day for 7 days, could stretch out for 1 puff twice a day.  Referral to Estelle Grumbles [facility] CPP Clinical Pharmacist to figure out status of med rx coverage  1 Air Supra rescue inhaler, albuterol + steroid, use as needed for flares.   Please schedule a Follow-up Appointment to: Return if symptoms worsen or fail to improve.  If you have any other questions or concerns, please feel free to call the office or send a message through MyChart. You may also schedule an earlier appointment if necessary.  Additionally, you may be receiving a survey about your experience at our office within a few days to 1 week by e-mail or mail. We value your feedback.  Saralyn Pilar, DO [facility], [place]

## 2023-04-15 NOTE — Progress Notes (Signed)
Subjective:    Patient ID: Vincent Dunn, male    DOB: June 05, 1961, 62 y.o.   MRN: [id number]  Vincent Dunn is a 62 y.o. male presenting on 04/15/2023 for GI Problem (The patient was seen in the ER 12/2022 and had a CT scan that was large intestine suspicious for colon cancer/ /)   HPI  Colon Mass / Thickening History of Abnormal CT 12/07/22 ED Visit documented on imaging, he was advised for GI referral for Colonoscopy. Referral sent to [facility] in 12/2022 for [facility] consult, and they did not take his insurance. It was delayed and switched to [facility] location. No referral scheduled. He saw a [facility] in May 2024 approx 02/14/23. They did a referral but again has not been processed Now today 4 months later from initial imaging result in ED, he needs to coordinate the referral, he is seeing our office out of pocket today due to ins network  He has abdominal pain and digestive symptoms as listed below, similar to his usual diverticulitis  History of Diverticulitis Irregular bowel movements, frequent diarrhea, and abdominal pain Has episodes of constipation and then looser stool diarrhea Admits episodic lower abdominal pain similar to prior diverticulitis Denies rectal bleeding  Anxiety Improved on Klonopin 1.5 tab twice a day for long term, has refills still   Centrilobular Emphysema / COPD LDCT screening in past.  Difficulty with breathing at times. Previously on medication but now w/o insurance he does not have maintenance therapy He remains physically active daily with 2 jobs. He can feel winded worse if laying down in evening  Improved on Breztri and Trelegy sample before but no longer has. Difficulty with insurance coverage on meds  Gout, chronic Continued on Allopurinol 100mg  x 2 = 200mg  daily, with good results, no gout flares   Hypothyroidism Continues on Levothyroxine daily.      04/15/2023    8:07 AM 02/21/2022    3:40 PM 02/02/2021    8:06 AM  Depression screen  PHQ 2/9  Decreased Interest 0 0 0  Down, Depressed, Hopeless 0 0 0  PHQ - 2 Score 0 0 0  Altered sleeping 0 2 0  Tired, decreased energy 0 1 0  Change in appetite 0 0 0  Feeling bad or failure about yourself  0 0 0  Trouble concentrating 0 0 0  Moving slowly or fidgety/restless 0 0 0  Suicidal thoughts 0 0 0  PHQ-9 Score 0 3 0  Difficult doing work/chores Not difficult at all Not difficult at all Not difficult at all    Social History   Tobacco Use   Smoking status: Every Day    Packs/day: 0.50    Years: 45.00    Additional pack years: 0.00    Total pack years: 22.50    Types: Cigarettes   Smokeless tobacco: Never   Tobacco comments:    down to 1-2 cig per week  Vaping Use   Vaping Use: Never used  Substance Use Topics   Alcohol use: Yes    Alcohol/week: 10.0 standard drinks of alcohol    Types: 10 Standard drinks or equivalent per week   Drug use: Never    Review of Systems Per HPI unless specifically indicated above     Objective:    BP (!) 140/72 (BP Location: Left Arm, Patient Position: Sitting, Cuff Size: Normal)   Pulse 68   Resp 18   Ht 5\' 10"  (1.778 m)   Wt 196 lb  12.8 oz (89.3 kg)   SpO2 96%   BMI 28.24 kg/m   Wt Readings from Last 3 Encounters:  04/15/23 196 lb 12.8 oz (89.3 kg)  12/07/22 191 lb 2.2 oz (86.7 kg)  02/21/22 191 lb 3.2 oz (86.7 kg)    Physical Exam Vitals and nursing note reviewed.  Constitutional:      General: He is not in acute distress.    Appearance: He is well-developed. He is not diaphoretic.     Comments: Well-appearing, comfortable, cooperative  HENT:     Head: Normocephalic and atraumatic.  Eyes:     General:        Right eye: No discharge.        Left eye: No discharge.     Conjunctiva/sclera: Conjunctivae normal.  Neck:     Thyroid: No thyromegaly.  Cardiovascular:     Rate and Rhythm: Normal rate and regular rhythm.     Pulses: Normal pulses.     Heart sounds: Normal heart sounds. No murmur  heard. Pulmonary:     Effort: Pulmonary effort is normal. No respiratory distress.     Breath sounds: Wheezing present. No rales.  Musculoskeletal:        General: Normal range of motion.     Cervical back: Normal range of motion and neck supple.  Lymphadenopathy:     Cervical: No cervical adenopathy.  Skin:    General: Skin is warm and dry.     Findings: No erythema or rash.  Neurological:     Mental Status: He is alert and oriented to person, place, and time. Mental status is at baseline.  Psychiatric:        Behavior: Behavior normal.     Comments: Well groomed, good eye contact, normal speech and thoughts     I have personally reviewed the radiology report from 12/07/22 on CT Angio / Abd.  CLINICAL DATA:  Chest pain radiating to the left arm.   EXAM: CT ANGIOGRAPHY CHEST, ABDOMEN AND PELVIS   TECHNIQUE: Non-contrast CT of the chest was initially obtained.   Multidetector CT imaging through the chest, abdomen and pelvis was performed using the standard protocol during bolus administration of intravenous contrast. Multiplanar reconstructed images and MIPs were obtained and reviewed to evaluate the vascular anatomy.   RADIATION DOSE REDUCTION: This exam was performed according to the departmental dose-optimization program which includes automated exposure control, adjustment of the mA and/or kV according to patient size and/or use of iterative reconstruction technique.   CONTRAST:  OMNIPAQUE IOHEXOL 350 MG/ML SOLN   COMPARISON:  CT chest dated August 17, 2020.   FINDINGS: CTA CHEST FINDINGS   Cardiovascular: Preferential opacification of the thoracic aorta. No evidence of thoracic aortic dissection. Mild aneurysmal dilatation of the ascending thoracic aorta measuring up to 4.1 cm in diameter, not significantly changed since 2021. Coronary, aortic arch, and branch vessel atherosclerotic vascular disease. Normal heart size. No pericardial effusion. No  pulmonary embolism.   Mediastinum/Nodes: No enlarged mediastinal, hilar, or axillary lymph nodes. Thyroid gland, trachea, and esophagus demonstrate no significant findings.   Lungs/Pleura: No focal consolidation, pleural effusion, or pneumothorax. Tiny subcentimeter pulmonary nodules in the right lung are unchanged, benign. No follow-up imaging is recommended. No new suspicious pulmonary nodule. Unchanged minimal scarring at the lung bases.   Musculoskeletal: No chest wall abnormality. No acute or significant osseous findings. Small benign sclerotic lesion in the posterior left eighth rib is unchanged.   Review of the MIP images confirms the  above findings.   CTA ABDOMEN AND PELVIS FINDINGS   VASCULAR   Aorta: Normal caliber aorta without aneurysm, dissection, vasculitis or significant stenosis. Scattered calcified and noncalcified atherosclerotic plaque.   Celiac: Patent without evidence of aneurysm, dissection, vasculitis or significant stenosis.   SMA: Patent without evidence of aneurysm, dissection, vasculitis or significant stenosis.   Renals: Both renal arteries are patent without evidence of aneurysm, dissection, vasculitis, fibromuscular dysplasia or significant stenosis.   IMA: Patent without evidence of aneurysm, dissection, vasculitis or significant stenosis.   Inflow: Patent without evidence of aneurysm, dissection, vasculitis or significant stenosis.   Veins: No obvious venous abnormality within the limitations of this arterial phase study.   Review of the MIP images confirms the above findings.   NON-VASCULAR   Hepatobiliary: No focal liver abnormality. Unchanged multiple tiny layering gallstones. No gallbladder wall thickening or biliary dilatation.   Pancreas: Unremarkable. No pancreatic ductal dilatation or surrounding inflammatory changes.   Spleen: Normal in size without focal abnormality.   Adrenals/Urinary Tract: Adrenal glands are  unremarkable. Kidneys are normal, without renal calculi, solid lesion, or hydronephrosis. Bladder is unremarkable.   Stomach/Bowel: Focal asymmetric masslike wall thickening of the proximal to mid sigmoid colon (series 7, image 213; series 10, image 65). The stomach and small bowel are unremarkable. Normal appendix.   Lymphatic: No enlarged abdominal or pelvic lymph nodes.   Reproductive: Prostate is unremarkable.   Other: No abdominal wall hernia or abnormality. No abdominopelvic ascites. No pneumoperitoneum.   Musculoskeletal: No acute or significant osseous findings.   Review of the MIP images confirms the above findings.   IMPRESSION: VASCULAR:   1. No evidence of thoracoabdominal aortic dissection. 2. Unchanged mild aneurysmal dilatation of the ascending thoracic aorta measuring up to 4.1 cm in diameter. Recommend annual imaging followup by CTA or MRA. This recommendation follows 2010 ACCF/AHA/AATS/ACR/ASA/SCA/SCAI/SIR/STS/SVM Guidelines for the Diagnosis and Management of Patients with Thoracic Aortic Disease. Circulation. 2010; 121: Z610-R604. Aortic aneurysm NOS (ICD10-I71.9) 3.  Aortic atherosclerosis (ICD10-I70.0).   CHEST:   1. No acute intrathoracic process.   ABDOMEN AND PELVIS:   1. No acute intra-abdominal process. 2. Focal asymmetric mass-like wall thickening of the proximal to mid sigmoid colon, concerning for primary colonic neoplasm. Colonoscopy is recommended for further evaluation. 3. Unchanged cholelithiasis.     Electronically Signed   By: Obie Dredge M.D.   On: 12/07/2022 14:06  Results for orders placed or performed during the hospital encounter of 12/07/22  Basic metabolic panel  Result Value Ref Range   Sodium 139 135 - 145 mmol/L   Potassium 3.9 3.5 - 5.1 mmol/L   Chloride 108 98 - 111 mmol/L   CO2 22 22 - 32 mmol/L   Glucose, Bld 103 (H) 70 - 99 mg/dL   BUN 10 8 - 23 mg/dL   Creatinine, Ser 5.40 0.61 - 1.24 mg/dL   Calcium 8.6  (L) 8.9 - 10.3 mg/dL   GFR, Estimated >98 >11 mL/min   Anion gap 9 5 - 15  CBC  Result Value Ref Range   WBC 7.0 4.0 - 10.5 K/uL   RBC 5.59 4.22 - 5.81 MIL/uL   Hemoglobin 18.2 (H) 13.0 - 17.0 g/dL   HCT 91.4 (H) 78.2 - 95.6 %   MCV 96.4 80.0 - 100.0 fL   MCH 32.6 26.0 - 34.0 pg   MCHC 33.8 30.0 - 36.0 g/dL   RDW 21.3 08.6 - 57.8 %   Platelets 264 150 - 400 K/uL   nRBC  0.0 0.0 - 0.2 %  Troponin I (High Sensitivity)  Result Value Ref Range   Troponin I (High Sensitivity) 3 <18 ng/L  Troponin I (High Sensitivity)  Result Value Ref Range   Troponin I (High Sensitivity) 2 <18 ng/L      Assessment & Plan:   Problem List Items Addressed This Visit     Centrilobular emphysema (HCC)   Generalized anxiety disorder with panic attacks   Other Visit Diagnoses     Colonic mass    -  Primary   Relevant Orders   Ambulatory referral to Gastroenterology   Abnormal CT scan, colon       Relevant Orders   Ambulatory referral to Gastroenterology      #Colon Mass on CT / Suspicious for Colon Cancer Diverticulitis Initial imaging 12/07/22 CT in ED Unsuccessful multiple attempts at GI referral since that time due to insurance. Now he needs [facility] New urgent referral placed today communicating with our referral coordinator He has symptoms with abdominal pain abnormal bowels but this can be related to diverticulitis or other chronic digestion He will need Colonoscopy ASAP and further diagnosis / management.  #Centrilobular Emphysema COPD Previously improved on inhalers maintenance but not covered on Rx drug plan, prior attempt at Med Management unsuccessful 03/2022. Will reconnect him with our Clinical Pharmacy. Contact Gentry Fitz about restarting Medication Assistance. She returns 04/22/23 Forward chart  #Anxiety Controlled continue medication Klonopin, refill when ready.  #Gout Controlled on Allopurinol 200mg  daily  Orders Placed This Encounter  Procedures   Ambulatory  referral to Gastroenterology    Referral Priority:   Urgent    Referral Type:   Consultation    Referral Reason:   Specialty Services Required    Number of Visits Requested:   1      No orders of the defined types were placed in this encounter.     Follow up plan: Return if symptoms worsen or fail to improve.   Saralyn Pilar, DO [facility] 04/15/2023, 8:12 AM

## 2023-04-16 ENCOUNTER — Other Ambulatory Visit: Payer: Self-pay | Admitting: Family Medicine

## 2023-04-16 DIAGNOSIS — M1A0710 Idiopathic chronic gout, right ankle and foot, without tophus (tophi): Secondary | ICD-10-CM

## 2023-04-16 NOTE — Telephone Encounter (Signed)
Requested medication (s) are due for refill today: yes  Requested medication (s) are on the active medication list: yes    Last refill: 02/21/22  #30  2 refills  Future visit scheduled no  Notes to clinic:Failed due to labs, please review. Thank you.  Requested Prescriptions  Pending Prescriptions Disp Refills   colchicine 0.6 MG tablet 30 tablet 2    Sig: TAKE 1 TABLET BY MOUTH EVERY DAY AS NEEDED FOR GOUT FLARE. REPEAT 1 PILL 2 HOURS LATER IF NEEDED. THEN CONTINUE DAILY FOR UP TO 3-5 DAYS AS NEEDED     Endocrinology:  Gout Agents - colchicine Failed - 04/16/2023  2:09 PM      Failed - ALT in normal range and within 360 days    ALT  Date Value Ref Range Status  07/27/2020 41 9 - 46 U/L Final   SGPT (ALT)  Date Value Ref Range Status  08/09/2012 23 12 - 78 U/L Final         Failed - AST in normal range and within 360 days    AST  Date Value Ref Range Status  07/27/2020 29 10 - 35 U/L Final   SGOT(AST)  Date Value Ref Range Status  08/09/2012 20 15 - 37 Unit/L Final         Failed - CBC within normal limits and completed in the last 12 months    WBC  Date Value Ref Range Status  12/07/2022 7.0 4.0 - 10.5 K/uL Final   RBC  Date Value Ref Range Status  12/07/2022 5.59 4.22 - 5.81 MIL/uL Final   Hemoglobin  Date Value Ref Range Status  12/07/2022 18.2 (H) 13.0 - 17.0 g/dL Final   HGB  Date Value Ref Range Status  08/09/2012 14.7 13.0 - 18.0 g/dL Final   HCT  Date Value Ref Range Status  12/07/2022 53.9 (H) 39.0 - 52.0 % Final  08/09/2012 43.4 40.0 - 52.0 % Final   MCHC  Date Value Ref Range Status  12/07/2022 33.8 30.0 - 36.0 g/dL Final   [facility]  Date Value Ref Range Status  12/07/2022 32.6 26.0 - 34.0 pg Final   MCV  Date Value Ref Range Status  12/07/2022 96.4 80.0 - 100.0 fL Final  08/09/2012 95 80 - 100 fL Final   No results found for: "PLTCOUNTKUC", "LABPLAT", "POCPLA" RDW  Date Value Ref Range Status  12/07/2022 13.2 11.5 - 15.5 % Final   08/09/2012 13.5 11.5 - 14.5 % Final         Passed - Cr in normal range and within 360 days    Creat  Date Value Ref Range Status  07/27/2020 0.92 0.70 - 1.25 mg/dL Final    Comment:    For patients >11 years of age, the reference limit for Creatinine is approximately 13% higher for people identified as African-American. .    Creatinine, Ser  Date Value Ref Range Status  12/07/2022 0.73 0.61 - 1.24 mg/dL Final         Passed - Valid encounter within last 12 months    Recent Outpatient Visits           Yesterday Colonic mass   [facility] [facility] Smitty Cords, DO   1 year ago Centrilobular emphysema [facility])   [place] Smitty Cords, DO   2 years ago Acquired hypothyroidism    [facility] Smitty Cords, DO   2 years  ago Annual physical exam   [place] [facility] [place], [name], DO   3 years ago Chronic pain of both shoulders   [facility], [name], [place]

## 2023-04-16 NOTE — Telephone Encounter (Signed)
Medication Refill - Medication: colchicine 0.6 MG tablet   Has the patient contacted their pharmacy? Yes.   (Agent: If no, request that the patient contact the pharmacy for the refill. If patient does not wish to contact the pharmacy document the reason why and proceed with request.) (Agent: If yes, when and what did the pharmacy advise?)  Preferred Pharmacy (with phone number or street name):  [facility][id number] Cheree Ditto, [place] - [address] AT [facility] OF [address] [facility] Phone: [phone]  Fax: [phone]     Has the patient been seen for an appointment in the last year OR does the patient have an upcoming appointment? Yes.    Agent: Please be advised that RX refills may take up to 3 business days. We ask that you follow-up with your pharmacy.

## 2023-04-17 ENCOUNTER — Other Ambulatory Visit: Payer: Self-pay | Admitting: Family Medicine

## 2023-04-17 DIAGNOSIS — M1A0710 Idiopathic chronic gout, right ankle and foot, without tophus (tophi): Secondary | ICD-10-CM

## 2023-04-17 MED ORDER — COLCHICINE 0.6 MG PO TABS: ORAL_TABLET | ORAL | 2 refills | Status: AC

## 2023-04-17 MED ORDER — COLCHICINE 0.6 MG PO TABS: ORAL_TABLET | ORAL | 2 refills | Status: DC

## 2023-04-22 ENCOUNTER — Ambulatory Visit: Payer: Self-pay | Admitting: Pharmacist

## 2023-04-22 DIAGNOSIS — J432 Centrilobular emphysema: Secondary | ICD-10-CM

## 2023-04-22 NOTE — Progress Notes (Signed)
04/22/2023 Name: Vincent Dunn MRN: [id number] DOB: 19-Jul-1961  Chief Complaint  Patient presents with   Medication Assistance    Vincent Dunn is a 62 y.o. year old male who presented for a telephone visit.   They were referred to the pharmacist by their PCP for assistance in managing medication access.    Subjective:  Care Team: Primary Care Provider: Smitty Cords, DO  Medication Access/Adherence  Current Pharmacy:  [facility] STORE [phone] Cheree Ditto, [place] - [address] AT [facility] OF [address] [place] [address] [place] [address] Phone: [phone] Fax: [phone]  [facility]ORE #[id number] [place] Rough, [place] - [address] AT [facility] [address] [place] [address] Phone: [phone] Fax: [phone]   Patient reports affordability concerns with their medications: Yes  Patient reports access/transportation concerns to their pharmacy: No  Patient reports adherence concerns with their medications:  No     COPD: Current treatment: - Breztri inhaler - 2 puffs twice daily (from sample from PCP) - Albuterol inhaler - 1-2 puffs every 6 hours as needed  Previous therapies tried: Trelegy  Reports doing well on Breztri inhaler  Current medication access support: none - Reports has ~1 week supply of Breztri inhaler remaining from sample - Patient no longer eligible for assistance from [facility] as reports now has [employer] coverage - Reports has not yet tried to fill prescription through his pharmacy as does not have a current prescription   Objective:  Lab Results  Component Value Date   HGBA1C 5.4 07/27/2020    Lab Results  Component Value Date   CREATININE 0.73 12/07/2022   BUN 10 12/07/2022   NA 139 12/07/2022   K 3.9 12/07/2022   CL 108 12/07/2022   CO2 22 12/07/2022    Lab Results  Component Value Date   CHOL 175 01/26/2021   HDL 42 01/26/2021   LDLCALC 101 (H) 01/26/2021   TRIG 197  (H) 01/26/2021   CHOLHDL 4.2 01/26/2021    Medications Reviewed Today     Reviewed by Manuela Neptune, RPH-CPP (Pharmacist) on 04/22/23 at 1626  Med List Status: <None>   Medication Order Taking? Sig Documenting Provider Last Dose Status Informant  albuterol (VENTOLIN HFA) 108 (90 Base) MCG/ACT inhaler [id number] No Inhale 1-2 puffs into the lungs every 6 (six) hours as needed for wheezing or shortness of breath. Smitty Cords, DO Taking Active   allopurinol (ZYLOPRIM) 100 MG tablet [id number] No Take 2 tablets (200 mg total) by mouth daily. Smitty Cords, DO Taking Active   BREZTRI AEROSPHERE 160-9-4.8 MCG/ACT AERO [id number] No Inhale 2 puffs into the lungs 2 (two) times daily. Smitty Cords, DO Taking Active   clonazePAM (KLONOPIN) 0.5 MG tablet [id number] No TAKE 1 AND 1/2 TABLETS(0.75 MG) BY MOUTH TWICE DAILY Smitty Cords, DO Taking Active   colchicine 0.6 MG tablet [id number]  TAKE 1 TABLET BY MOUTH EVERY DAY AS NEEDED FOR GOUT FLARE. REPEAT 1 PILL 2 HOURS LATER IF NEEDED. THEN CONTINUE DAILY FOR UP TO 3-5 DAYS AS NEEDED Karamalegos, [name], DO  Active   cyclobenzaprine (FLEXERIL) 10 MG tablet [id number] No Take 1 tablet (10 mg total) by mouth at bedtime. Smitty Cords, DO Taking Active   levothyroxine (SYNTHROID) 200 MCG tablet [id number] No Take 1 tablet (200 mcg total) by mouth daily before breakfast. Smitty Cords, DO Taking Active  Assessment/Plan:   COPD: - Have previously counseled patient on administration technique for Breztri inhaler - Will collaborate with PCP to request provider send prescriptions for inhalers to pharmacy for patient - Counsel patient on using Breztri manufacturer savings card in addition to his insurance reduce cost of Breztri inhaler.  Patient states will have his daughter Lequita Halt help him with downloading savings card, will provide this information to pharmacy and  follow up to determine final cost.   Follow Up Plan: Clinical Pharmacist will follow up with patient by telephone on 04/26/2023 at 11:15 AM   Estelle Grumbles, PharmD, P[place]y [place], CPP Clinical Pharmacist [facility] [phone]

## 2023-04-22 NOTE — Patient Instructions (Signed)
Goals Addressed             This Visit's Progress    Pharmacy Goals       Reminders for using your Breztri inhaler:  Please remember to prime your inhaler before using it for the first time. To do this, remove the cap from the mouthpiece, hold it upright, facing away from you, and shake well. Push the canister all the way down until it stops moving in the actuator to release a puff of medicine from the mouthpiece. You may hear a soft click from the dose indicator as it counts down. Repeat this process three times more.  To use the inhaler, take the cap off and shake the primed inhaler. Exhale as much as you reasonably can through your mouth, then close your lips around the mouthpiece and tilt your head back, keeping your tongue below the mouthpiece. Inhale deeply and slowly while pressing down on the canister until it stops moving. You should feel a puff of medicine. Remove the inhaler while holding your breath for as long as you comfortably can, up to 10 seconds, before breathing out gently. Repeat this process once more and replace the cap. You should be taking 2 puffs, 2 times a day. After your second puff, rinse your mouth with water to remove any excess medicine, making sure you don't swallow the water.    Thank you!  Estelle Grumbles, PharmD, P[place]y [place], CPP Clinical Pharmacist [facility] [phone]

## 2023-04-23 ENCOUNTER — Other Ambulatory Visit: Payer: Self-pay | Admitting: Family Medicine

## 2023-04-23 DIAGNOSIS — J432 Centrilobular emphysema: Secondary | ICD-10-CM

## 2023-04-23 MED ORDER — ALBUTEROL SULFATE HFA 108 (90 BASE) MCG/ACT IN AERS: 1.0000 | INHALATION_SPRAY | Freq: Four times a day (QID) | RESPIRATORY_TRACT | 5 refills | Status: AC | PRN

## 2023-04-23 MED ORDER — BREZTRI AEROSPHERE 160-9-4.8 MCG/ACT IN AERO: 2.0000 | INHALATION_SPRAY | Freq: Two times a day (BID) | RESPIRATORY_TRACT | 11 refills | Status: DC

## 2023-04-24 ENCOUNTER — Ambulatory Visit: Payer: Self-pay | Admitting: Pharmacist

## 2023-04-24 DIAGNOSIS — J432 Centrilobular emphysema: Secondary | ICD-10-CM

## 2023-04-24 NOTE — Progress Notes (Signed)
   04/24/2023  Patient ID: Vincent Dunn, male   DOB: 1961/02/28, 62 y.o.   MRN: [id number]  Collaborated with PCP to request provider send prescriptions for Breztri and albuterol inhalers to pharmacy for patient   Note as of 03/09/2023, [employer], manufacturer of [place], has capped out of pocket cost of inhalers at $35/month for commercially insured patients.  Follow up with [facility] today on behalf of patient. Speak with [facility] Pharmacy Technician who advises cost of patient's Breztri inhaler is $35 and albuterol inhaler is $17.02. - Follow up with patient to let him know  Plan  Patient denies further medication questions or concerns today Provide patient with contact information for clinic pharmacist to contact if needed in future for medication questions/concerns  Estelle Grumbles, PharmD, [facility] Clinical Pharmacist [facility] [phone]

## 2023-04-24 NOTE — Patient Instructions (Signed)
Goals Addressed             This Visit's Progress    Pharmacy Goals       Reminders for using your Breztri inhaler:  Please remember to prime your inhaler before using it for the first time. To do this, remove the cap from the mouthpiece, hold it upright, facing away from you, and shake well. Push the canister all the way down until it stops moving in the actuator to release a puff of medicine from the mouthpiece. You may hear a soft click from the dose indicator as it counts down. Repeat this process three times more.  To use the inhaler, take the cap off and shake the primed inhaler. Exhale as much as you reasonably can through your mouth, then close your lips around the mouthpiece and tilt your head back, keeping your tongue below the mouthpiece. Inhale deeply and slowly while pressing down on the canister until it stops moving. You should feel a puff of medicine. Remove the inhaler while holding your breath for as long as you comfortably can, up to 10 seconds, before breathing out gently. Repeat this process once more and replace the cap. You should be taking 2 puffs, 2 times a day. After your second puff, rinse your mouth with water to remove any excess medicine, making sure you don't swallow the water.  Thank you!  Estelle Grumbles, PharmD, P[place]y [place], CPP Clinical Pharmacist [facility] [phone]

## 2023-04-26 ENCOUNTER — Telehealth: Payer: Self-pay

## 2023-07-01 ENCOUNTER — Other Ambulatory Visit: Payer: Self-pay | Admitting: Family Medicine

## 2023-07-01 DIAGNOSIS — F410 Panic disorder [episodic paroxysmal anxiety] without agoraphobia: Secondary | ICD-10-CM

## 2023-08-10 ENCOUNTER — Other Ambulatory Visit: Payer: Self-pay | Admitting: Family Medicine

## 2023-08-10 DIAGNOSIS — E039 Hypothyroidism, unspecified: Secondary | ICD-10-CM

## 2023-08-10 DIAGNOSIS — M1A0710 Idiopathic chronic gout, right ankle and foot, without tophus (tophi): Secondary | ICD-10-CM

## 2023-08-12 ENCOUNTER — Other Ambulatory Visit: Payer: Self-pay | Admitting: Family Medicine

## 2023-08-12 DIAGNOSIS — M1A0710 Idiopathic chronic gout, right ankle and foot, without tophus (tophi): Secondary | ICD-10-CM

## 2023-08-12 DIAGNOSIS — E039 Hypothyroidism, unspecified: Secondary | ICD-10-CM

## 2023-08-12 NOTE — Telephone Encounter (Signed)
Medication Refill - Medication: allopurinol (ZYLOPRIM) 100 MG tablet [[id number]] levothyroxine (SYNTHROID) 200 MCG tablet [[id number]]   Has the patient contacted their pharmacy? Yes.    (Agent: If yes, when and what did the pharmacy advise?) Pharmacy sent a request , told pt contact PCP   Preferred Pharmacy (with phone number or street name): [facility] [address] [place] [address]   Has the patient been seen for an appointment in the last year OR does the patient have an upcoming appointment? Yes.    Agent: Please be advised that RX refills may take up to 3 business days. We ask that you follow-up with your pharmacy.    Pt is completely out of medication and has been for 3-4 days.

## 2023-08-12 NOTE — Telephone Encounter (Signed)
Requested Prescriptions  Pending Prescriptions Disp Refills   levothyroxine (SYNTHROID) 200 MCG tablet [Pharmacy Med Name: LEVOTHYROXINE 0.2MG  ( ) TAB] 90 tablet 0    Sig: TAKE 1 TABLET(200 MCG) BY MOUTH DAILY BEFORE BREAKFAST     Endocrinology:  Hypothyroid Agents Failed - 08/10/2023 12:51 PM      Failed - TSH in normal range and within 360 days    TSH  Date Value Ref Range Status  01/26/2021 1.50 0.40 - 4.50 mIU/L Final         Passed - Valid encounter within last 12 months    Recent Outpatient Visits           3 months ago Centrilobular emphysema (HCC)   [place] [facility] Delles, Gentry Fitz A, RPH-CPP   3 months ago Centrilobular emphysema [facility])   [place] [facility] Delles, Gentry Fitz A, RPH-CPP   3 months ago Colonic mass   [place] [facility] Smitty Cords, DO   1 year ago Centrilobular emphysema [facility])   [place] [facility] [place], [name], DO   2 years ago Acquired hypothyroidism   [place] [facility] [place], [name], DO               allopurinol (ZYLOPRIM) 100 MG tablet [Pharmacy Med Name: ALLOPURINOL 100MG  TABLETS] 180 tablet 0    Sig: TAKE 2 TABLETS(200 MG) BY MOUTH DAILY     Endocrinology:  Gout Agents - allopurinol Failed - 08/10/2023 12:51 PM      Failed - Uric Acid in normal range and within 360 days    Uric Acid, Serum  Date Value Ref Range Status  07/27/2020 6.1 4.0 - 8.0 mg/dL Final    Comment:    Therapeutic target for gout patients: <6.0 mg/dL .          Failed - CBC within normal limits and completed in the last 12 months    WBC  Date Value Ref Range Status  12/07/2022 7.0 4.0 - 10.5 K/uL Final   RBC  Date Value Ref Range Status  12/07/2022 5.59 4.22 - 5.81 MIL/uL Final   Hemoglobin  Date Value Ref Range Status  12/07/2022 18.2 (H) 13.0 - 17.0 g/dL Final   HGB  Date Value Ref Range Status  08/09/2012  14.7 13.0 - 18.0 g/dL Final   HCT  Date Value Ref Range Status  12/07/2022 53.9 (H) 39.0 - 52.0 % Final  08/09/2012 43.4 40.0 - 52.0 % Final   MCHC  Date Value Ref Range Status  12/07/2022 33.8 30.0 - 36.0 g/dL Final   [facility]  Date Value Ref Range Status  12/07/2022 32.6 26.0 - 34.0 pg Final   MCV  Date Value Ref Range Status  12/07/2022 96.4 80.0 - 100.0 fL Final  08/09/2012 95 80 - 100 fL Final   No results found for: "PLTCOUNTKUC", "LABPLAT", "POCPLA" RDW  Date Value Ref Range Status  12/07/2022 13.2 11.5 - 15.5 % Final  08/09/2012 13.5 11.5 - 14.5 % Final         Passed - Cr in normal range and within 360 days    Creat  Date Value Ref Range Status  07/27/2020 0.92 0.70 - 1.25 mg/dL Final    Comment:    For patients >82 years of age, the reference limit for Creatinine is approximately 13% higher for people identified as African-American. .    Creatinine, Ser  Date Value Ref Range  Status  12/07/2022 0.73 0.61 - 1.24 mg/dL Final         Passed - Valid encounter within last 12 months    Recent Outpatient Visits           3 months ago Centrilobular emphysema [facility])   [place] [facility] Delles, Gentry Fitz A, RPH-CPP   3 months ago Centrilobular emphysema [facility])   [place] [facility] Delles, Jackelyn Poling, RPH-CPP   3 months ago Colonic mass   [place] [facility] Smitty Cords, DO   1 year ago Centrilobular emphysema [facility])   [place] [facility] Smitty Cords, DO   2 years ago Acquired hypothyroidism    [facility], [name], [place]

## 2023-08-13 NOTE — Telephone Encounter (Signed)
Medication refilled 08/12/23, duplicate request.  Requested Prescriptions  Pending Prescriptions Disp Refills   allopurinol (ZYLOPRIM) 100 MG tablet 180 tablet 3    Sig: Take 2 tablets (200 mg total) by mouth daily.     Endocrinology:  Gout Agents - allopurinol Failed - 08/12/2023 10:27 AM      Failed - Uric Acid in normal range and within 360 days    Uric Acid, Serum  Date Value Ref Range Status  07/27/2020 6.1 4.0 - 8.0 mg/dL Final    Comment:    Therapeutic target for gout patients: <6.0 mg/dL .          Failed - CBC within normal limits and completed in the last 12 months    WBC  Date Value Ref Range Status  12/07/2022 7.0 4.0 - 10.5 K/uL Final   RBC  Date Value Ref Range Status  12/07/2022 5.59 4.22 - 5.81 MIL/uL Final   Hemoglobin  Date Value Ref Range Status  12/07/2022 18.2 (H) 13.0 - 17.0 g/dL Final   HGB  Date Value Ref Range Status  08/09/2012 14.7 13.0 - 18.0 g/dL Final   HCT  Date Value Ref Range Status  12/07/2022 53.9 (H) 39.0 - 52.0 % Final  08/09/2012 43.4 40.0 - 52.0 % Final   MCHC  Date Value Ref Range Status  12/07/2022 33.8 30.0 - 36.0 g/dL Final   [facility]  Date Value Ref Range Status  12/07/2022 32.6 26.0 - 34.0 pg Final   MCV  Date Value Ref Range Status  12/07/2022 96.4 80.0 - 100.0 fL Final  08/09/2012 95 80 - 100 fL Final   No results found for: "PLTCOUNTKUC", "LABPLAT", "POCPLA" RDW  Date Value Ref Range Status  12/07/2022 13.2 11.5 - 15.5 % Final  08/09/2012 13.5 11.5 - 14.5 % Final         Passed - Cr in normal range and within 360 days    Creat  Date Value Ref Range Status  07/27/2020 0.92 0.70 - 1.25 mg/dL Final    Comment:    For patients >45 years of age, the reference limit for Creatinine is approximately 13% higher for people identified as African-American. .    Creatinine, Ser  Date Value Ref Range Status  12/07/2022 0.73 0.61 - 1.24 mg/dL Final         Passed - Valid encounter within last 12 months    Recent  Outpatient Visits           3 months ago Centrilobular emphysema [facility])   [place] [facility] Delles, Gentry Fitz A, RPH-CPP   3 months ago Centrilobular emphysema [facility])   [place] [facility] Delles, Jackelyn Poling, RPH-CPP   4 months ago Colonic mass   [place] [facility] Smitty Cords, DO   1 year ago Centrilobular emphysema [facility])   [place] [facility] Smitty Cords, DO   2 years ago Acquired hypothyroidism   [place] [facility] [place], [name], DO               levothyroxine (SYNTHROID) 200 MCG tablet 90 tablet 3    Sig: Take 1 tablet (200 mcg total) by mouth daily before breakfast.     Endocrinology:  Hypothyroid Agents Failed - 08/12/2023 10:27 AM      Failed - TSH in normal range and within 360 days    TSH  Date Value Ref Range Status  01/26/2021  1.50 0.40 - 4.50 mIU/L Final         Passed - Valid encounter within last 12 months    Recent Outpatient Visits           3 months ago Centrilobular emphysema [facility])   [place] [facility] Delles, Gentry Fitz A, RPH-CPP   3 months ago Centrilobular emphysema [facility])   [place] [facility] Delles, Jackelyn Poling, RPH-CPP   4 months ago Colonic mass   [place] [facility] Smitty Cords, DO   1 year ago Centrilobular emphysema [facility])   [place] [facility] Smitty Cords, DO   2 years ago Acquired hypothyroidism   [place] [facility] [place], [name], [place]

## 2023-09-02 ENCOUNTER — Ambulatory Visit: Payer: Self-pay

## 2023-09-02 NOTE — Telephone Encounter (Signed)
I have seen him once a year for the past 2+ years. He has had a variety of BP readings. But we have not had consistent follow-up on this particular issue, due to several other more urgent pressing medical conditions.  Given that his BP readings are persistent elevated. I would agree with following up sooner with me to discuss how to treat his High Blood Pressure.  If available apt earlier than 12/20 and he is able to re-schedule, that would be great.  This has been a longer term problem it seems, therefore, if the earliest he can get in is 12/20 that would be okay as well.  Saralyn Pilar, DO [facility] 09/02/2023, 6:44 PM

## 2023-09-02 NOTE — Telephone Encounter (Signed)
Chief Complaint: Elevated Blood pressure Symptoms: none currently  Frequency: comes and goes  Pertinent Negatives: Patient denies chest pain, nausea, vomiting, fever, weakness or numbness on one side of the body Disposition: [] ED /[] Urgent Care (no appt availability in office) / [x] Appointment(In office/virtual)/ []  [place] [facility]/ [] Home Care/ [] Refused Recommended Disposition /[] [place] [facility]/ []  Follow-up with PCP Additional Notes: Patient states he had a colonoscopy today and was told his blood pressure was elevated patient reported a blood pressure of 140/117 but what is listed in the OP visit note is 122/87 at 1405. Patient stated about 3 or 4 weeks ago he had experienced some dizziness, blurred vision, headaches and a few falls. He is staying with his dad and his dad checked his  blood pressure it was elevated. Patient states he has not been diagnosed with hypertension. Care advice was given and patient was offered a sooner appointment with PCP but patient declined stated that he will come in December unless PCP recommends he needs to come in sooner.  Reason for Disposition  Systolic BP  >= 180 OR Diastolic >= 110  Answer Assessment - Initial Assessment Questions 1. BLOOD PRESSURE: "What is the blood pressure?" "Did you take at least two measurements 5 minutes apart?"     140/117 2. ONSET: "When did you take your blood pressure?"     30 minutes ago  3. HOW: "How did you take your blood pressure?" (e.g., automatic home BP monitor, visiting nurse)     At the doctors office  4. HISTORY: "Do you have a history of high blood pressure?"     No 5. MEDICINES: "Are you taking any medicines for blood pressure?" "Have you missed any doses recently?"     No 6. OTHER SYMPTOMS: "Do you have any symptoms?" (e.g., blurred vision, chest pain, difficulty breathing, headache, weakness)     Dizzy, falls, headache, weakness, blurred vision  Protocols used: Blood Pressure - High-A-AH

## 2023-09-09 ENCOUNTER — Encounter: Payer: Self-pay | Admitting: Family Medicine

## 2023-09-09 ENCOUNTER — Ambulatory Visit (INDEPENDENT_AMBULATORY_CARE_PROVIDER_SITE_OTHER): Payer: Self-pay | Admitting: Family Medicine

## 2023-09-09 ENCOUNTER — Other Ambulatory Visit: Payer: Self-pay | Admitting: Family Medicine

## 2023-09-09 VITALS — BP 130/80 | HR 73 | Resp 16 | Ht 70.0 in | Wt 198.4 lb

## 2023-09-09 DIAGNOSIS — I10 Essential (primary) hypertension: Secondary | ICD-10-CM

## 2023-09-09 DIAGNOSIS — F430 Acute stress reaction: Secondary | ICD-10-CM

## 2023-09-09 MED ORDER — AMLODIPINE BESYLATE 10 MG PO TABS: 10.0000 mg | ORAL_TABLET | Freq: Every day | ORAL | 0 refills | Status: DC

## 2023-09-09 NOTE — Patient Instructions (Addendum)
Thank you for coming to the office today.  Start Amlodipine 10mg  - tablet, can take HALF tab to start for a few days to 1 week, see what the BP does, and if need go up to 1 whole tablet 10mg  daily  Next time we can adjust the med or add others if need  Consider CT Scan Head if there are persistent symptoms as discussed.  If severe BP still and BP >160/110 and other symptoms, can go to [facility] ED if severe symptoms.  Please schedule a Follow-up Appointment to: Return if symptoms worsen or fail to improve.  If you have any other questions or concerns, please feel free to call the office or send a message through MyChart. You may also schedule an earlier appointment if necessary.  Additionally, you may be receiving a survey about your experience at our office within a few days to 1 week by e-mail or mail. We value your feedback.  Saralyn Pilar, DO [facility], [place]

## 2023-09-09 NOTE — Progress Notes (Signed)
Subjective:    Patient ID: Vincent Dunn, male    DOB: 1961/06/08, 62 y.o.   MRN: [id number]  Vincent Dunn is a 62 y.o. male presenting on 09/09/2023 for Hypertension (Patient is c/o high blood )   HPI  Discussed the use of AI scribe software for clinical note transcription with the patient, who gave verbal consent to proceed.  Elevated BP, new diagnosis Hypertension  Presents with concerns about elevated blood pressure readings. He reports recent measurements ranging from 130/80 to 160/118, with the highest reading taken at home. The patient also describes experiencing episodes of feeling "drunk," tripping, and having problems with depth perception over the past month, which he believes may be related to his elevated blood pressure. These symptoms have been gradually worsening. The patient denies any history of high blood pressure prior to these recent events.  The patient also reports significant recent stressors, including marital and family issues, related to his wife having dearly dementia symptoms and causing significant home and legal trouble even involving her care and a court case, which he believes may be contributing to his elevated blood pressure. He notes an increase in fast food consumption due to these stressors. The patient denies any changes in physical activity or significant weight gain.  The patient also mentions a recent colonoscopy, during which high blood pressure was noted, prompting this consultation. He also mentions a past diagnosis of diverticulitis and a recent misdiagnosis of cancer, which was later refuted during a colonoscopy. 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  The patient is not currently on any antihypertensive medications. He is prescribed clonazepam for stress and anxiety.          09/09/2023   11:53 AM 04/15/2023    8:07 AM 02/21/2022    3:40 PM   Depression screen PHQ 2/9  Decreased Interest 0 0 0  Down, Depressed, Hopeless 0 0 0  PHQ - 2 Score 0 0 0  Altered sleeping 0 0 2  Tired, decreased energy 0 0 1  Change in appetite 0 0 0  Feeling bad or failure about yourself  0 0 0  Trouble concentrating 0 0 0  Moving slowly or fidgety/restless 0 0 0  Suicidal thoughts 0 0 0  PHQ-9 Score 0 0 3  Difficult doing work/chores Not difficult at all Not difficult at all Not difficult at all       09/09/2023   11:53 AM 04/15/2023    8:08 AM 02/21/2022    3:40 PM 02/02/2021    8:07 AM  GAD 7 : Generalized Anxiety Score  Nervous, Anxious, on Edge 0 0 1 0  Control/stop worrying 0 0 0 0  Worry too much - different things 0 0 0 0  Trouble relaxing 0 0 0 0  Restless 0 0 0 0  Easily annoyed or irritable 0 0 0 0  Afraid - awful might happen 0 0 0 0  Total GAD 7 Score 0 0 1 0  Anxiety Difficulty Not difficult at all Not difficult at all Not difficult at all Not difficult at all    Social History   Tobacco Use   Smoking status: Every Day    Current packs/day: 0.50    Average packs/day: 0.5 packs/day for 45.0 years (22.5 ttl pk-yrs)    Types: Cigarettes   Smokeless tobacco: Never   Tobacco comments:    down to  1-2 cig per week  Vaping Use   Vaping status: Never Used  Substance Use Topics   Alcohol use: Yes    Alcohol/week: 10.0 standard drinks of alcohol    Types: 10 Standard drinks or equivalent per week   Drug use: Never    Review of Systems Per HPI unless specifically indicated above     Objective:    BP (!) 130/90 (BP Location: Left Arm, Patient Position: Sitting, Cuff Size: Normal)   Pulse 73   Resp 16   Ht 5\' 10"  (1.778 m)   Wt 198 lb 6.4 oz (90 kg)   BMI 28.47 kg/m   Wt Readings from Last 3 Encounters:  09/09/23 198 lb 6.4 oz (90 kg)  04/15/23 196 lb 12.8 oz (89.3 kg)  12/07/22 191 lb 2.2 oz (86.7 kg)   130/90, Left arm 130/80 Right arm 139/81 was machine automatic BP   Physical Exam Vitals and nursing  note reviewed.  Constitutional:      General: He is not in acute distress.    Appearance: Normal appearance. He is well-developed. He is not diaphoretic.     Comments: Well-appearing, comfortable, cooperative  HENT:     Head: Normocephalic and atraumatic.  Eyes:     General:        Right eye: No discharge.        Left eye: No discharge.     Conjunctiva/sclera: Conjunctivae normal.  Cardiovascular:     Rate and Rhythm: Normal rate.  Pulmonary:     Effort: Pulmonary effort is normal.  Skin:    General: Skin is warm and dry.     Findings: No erythema or rash.  Neurological:     General: No focal deficit present.     Mental Status: He is alert and oriented to person, place, and time. Mental status is at baseline.     Cranial Nerves: No cranial nerve deficit.     Sensory: No sensory deficit.  Psychiatric:        Mood and Affect: Mood normal.        Behavior: Behavior normal.        Thought Content: Thought content normal.     Comments: Well groomed, good eye contact, normal speech and thoughts       Results for orders placed or performed during the hospital encounter of 12/07/22  Basic metabolic panel  Result Value Ref Range   Sodium 139 135 - 145 mmol/L   Potassium 3.9 3.5 - 5.1 mmol/L   Chloride 108 98 - 111 mmol/L   CO2 22 22 - 32 mmol/L   Glucose, Bld 103 (H) 70 - 99 mg/dL   BUN 10 8 - 23 mg/dL   Creatinine, Ser 6.04 0.61 - 1.24 mg/dL   Calcium 8.6 (L) 8.9 - 10.3 mg/dL   GFR, Estimated >54 >09 mL/min   Anion gap 9 5 - 15  CBC  Result Value Ref Range   WBC 7.0 4.0 - 10.5 K/uL   RBC 5.59 4.22 - 5.81 MIL/uL   Hemoglobin 18.2 (H) 13.0 - 17.0 g/dL   HCT 81.1 (H) 91.4 - 78.2 %   MCV 96.4 80.0 - 100.0 fL   MCH 32.6 26.0 - 34.0 pg   MCHC 33.8 30.0 - 36.0 g/dL   RDW 95.6 21.3 - 08.6 %   Platelets 264 150 - 400 K/uL   nRBC 0.0 0.0 - 0.2 %  Troponin I (High Sensitivity)  Result Value Ref Range   Troponin I (  High Sensitivity) 3 <18 ng/L  Troponin I (High Sensitivity)   Result Value Ref Range   Troponin I (High Sensitivity) 2 <18 ng/L      Assessment & Plan:   Problem List Items Addressed This Visit   None Visit Diagnoses     Essential hypertension    -  Primary   Relevant Medications   amLODipine (NORVASC) 10 MG tablet         Hypertension Newly elevated blood pressure readings with highest reported as 160/118 at home. BP here 130/80-90s  Patient reports increased stressors and fast food intake. No history of hypertension or antihypertensive medications.  -Start Amlodipine 10mg  daily, can start with half tablet for a few days to one week and then increase to full dose if tolerated and needed.  -Check blood pressure regularly at home. -Return for follow-up appointment on 09/27/2023 to assess response to medication.  Neurological symptoms Reports of feeling "drunk," tripping, and problems with depth perception over the past month. - Seems episodic, no other focal deficits or active symptoms  Symptoms do not clearly correlate with hypertension but may be related to elevated blood pressure. -If symptoms persist despite blood pressure control, consider ordering a CT scan of the head to rule out other causes.  Psychosocial stressors Significant stressors reported including wife's early dementia and legal issues. Patient already has Clonazepam for anxiety. -Encourage continued use of Clonazepam as needed for anxiety. -Consider referral to counseling therapy if needed next      No orders of the defined types were placed in this encounter.   Meds ordered this encounter  Medications   amLODipine (NORVASC) 10 MG tablet    Sig: Take 1 tablet (10 mg total) by mouth daily.    Dispense:  30 tablet    Refill:  0    Follow up plan: Return if symptoms worsen or fail to improve.   Saralyn Pilar, DO [place] [facility] 09/09/2023, 11:19 AM

## 2023-09-12 NOTE — Telephone Encounter (Signed)
Reordered 09/09/23 #30 courtesy refill  Requested Prescriptions  Refused Prescriptions Disp Refills   amLODipine (NORVASC) 10 MG tablet [Pharmacy Med Name: AMLODIPINE BESYLATE 10MG  TABLETS] 90 tablet     Sig: TAKE 1 TABLET(10 MG) BY MOUTH DAILY     Cardiovascular: Calcium Channel Blockers 2 Failed - 09/09/2023 12:31 PM      Failed - Last BP in normal range    BP Readings from Last 1 Encounters:  09/09/23 130/80         Passed - Last Heart Rate in normal range    Pulse Readings from Last 1 Encounters:  09/09/23 73         Passed - Valid encounter within last 6 months    Recent Outpatient Visits           3 days ago Essential hypertension   [place] [facility] [place], [name], DO   4 months ago Centrilobular emphysema [facility])   [place] [facility] Delles, Gentry Fitz A, RPH-CPP   4 months ago Centrilobular emphysema [facility])   [place] [facility] D[facility], Gentry Fitz A, RPH-CPP   5 months ago Colonic mass   [place] [facility] Smitty Cords, DO   1 year ago Centrilobular emphysema [facility])   [place] [facility] Smitty Cords, DO       Future Appointments             In 2 weeks Althea Charon, [name], DO [place] [facility]

## 2023-09-27 ENCOUNTER — Other Ambulatory Visit: Payer: Self-pay | Admitting: Family Medicine

## 2023-09-27 ENCOUNTER — Encounter: Payer: Self-pay | Admitting: Family Medicine

## 2023-09-27 ENCOUNTER — Ambulatory Visit (INDEPENDENT_AMBULATORY_CARE_PROVIDER_SITE_OTHER): Payer: Self-pay | Admitting: Family Medicine

## 2023-09-27 DIAGNOSIS — Z125 Encounter for screening for malignant neoplasm of prostate: Secondary | ICD-10-CM

## 2023-09-27 DIAGNOSIS — D582 Other hemoglobinopathies: Secondary | ICD-10-CM

## 2023-09-27 DIAGNOSIS — M1A0710 Idiopathic chronic gout, right ankle and foot, without tophus (tophi): Secondary | ICD-10-CM

## 2023-09-27 DIAGNOSIS — E782 Mixed hyperlipidemia: Secondary | ICD-10-CM

## 2023-09-27 DIAGNOSIS — E039 Hypothyroidism, unspecified: Secondary | ICD-10-CM

## 2023-09-27 DIAGNOSIS — G8929 Other chronic pain: Secondary | ICD-10-CM

## 2023-09-27 DIAGNOSIS — R7309 Other abnormal glucose: Secondary | ICD-10-CM

## 2023-09-27 DIAGNOSIS — M25511 Pain in right shoulder: Secondary | ICD-10-CM

## 2023-09-27 DIAGNOSIS — M25512 Pain in left shoulder: Secondary | ICD-10-CM

## 2023-09-27 DIAGNOSIS — M1A09X0 Idiopathic chronic gout, multiple sites, without tophus (tophi): Secondary | ICD-10-CM

## 2023-09-27 DIAGNOSIS — F410 Panic disorder [episodic paroxysmal anxiety] without agoraphobia: Secondary | ICD-10-CM

## 2023-09-27 DIAGNOSIS — I10 Essential (primary) hypertension: Secondary | ICD-10-CM

## 2023-09-27 DIAGNOSIS — J432 Centrilobular emphysema: Secondary | ICD-10-CM

## 2023-09-27 DIAGNOSIS — F411 Generalized anxiety disorder: Secondary | ICD-10-CM

## 2023-09-27 MED ORDER — CLONAZEPAM 0.5 MG PO TABS: ORAL_TABLET | ORAL | 2 refills | Status: DC

## 2023-09-27 MED ORDER — CYCLOBENZAPRINE HCL 10 MG PO TABS: 10.0000 mg | ORAL_TABLET | Freq: Every day | ORAL | 1 refills | Status: DC

## 2023-09-27 MED ORDER — ALLOPURINOL 100 MG PO TABS: 200.0000 mg | ORAL_TABLET | Freq: Every day | ORAL | 3 refills | Status: DC

## 2023-09-27 MED ORDER — LEVOTHYROXINE SODIUM 200 MCG PO TABS: 200.0000 ug | ORAL_TABLET | Freq: Every day | ORAL | 3 refills | Status: DC

## 2023-09-27 MED ORDER — AMLODIPINE BESYLATE 10 MG PO TABS: 10.0000 mg | ORAL_TABLET | Freq: Every day | ORAL | 3 refills | Status: DC

## 2023-09-27 NOTE — Progress Notes (Signed)
Subjective:    Patient ID: Vincent Dunn, male    DOB: 22-Feb-1961, 62 y.o.   MRN: [id number]  Vincent Dunn is a 62 y.o. male presenting on 09/27/2023 for Medical Management of Chronic Issues   HPI  Discussed the use of AI scribe software for clinical note transcription with the patient, who gave verbal consent to proceed.  History of Present Illness     Hypertension, follow up Hypothyroidism Gout History  The patient, with a history of hypertension, presented for a follow-up visit to manage his blood pressure and refill medications. He reported that his blood pressure readings have been normal since starting amlodipine 10mg . Initially, he experienced a "weird feeling" with the full dose, leading him to halve the dose for a week before returning to the full dose. He denied any episodes of imbalance or falls since starting the medication.  The patient also reported taking allopurinol for gout and levothyroxine for thyroid issues, with sufficient supply for two more months. He also takes clonazepam and a muscle relaxant, the names of which were not specified in the conversation.  The patient denied any fluid retention or swelling in his feet or ankles. He also mentioned personal health issues, including a cancer scare and a diabetes misdiagnosis, which have since been resolved. He did not report any other symptoms or health concerns during this visit.         Health Maintenance: Flu Shot updated already     09/27/2023    8:34 AM 09/09/2023   11:53 AM 04/15/2023    8:07 AM  Depression screen PHQ 2/9  Decreased Interest 0 0 0  Down, Depressed, Hopeless 0 0 0  PHQ - 2 Score 0 0 0  Altered sleeping 1 0 0  Tired, decreased energy 0 0 0  Change in appetite 0 0 0  Feeling bad or failure about yourself  0 0 0  Trouble concentrating 0 0 0  Moving slowly or fidgety/restless 0 0 0  Suicidal thoughts 0 0 0  PHQ-9 Score 1 0 0  Difficult doing work/chores  Not difficult at all Not difficult  at all       09/27/2023    8:34 AM 09/09/2023   11:53 AM 04/15/2023    8:08 AM 02/21/2022    3:40 PM  GAD 7 : Generalized Anxiety Score  Nervous, Anxious, on Edge 0 0 0 1  Control/stop worrying 0 0 0 0  Worry too much - different things 0 0 0 0  Trouble relaxing 0 0 0 0  Restless 0 0 0 0  Easily annoyed or irritable 0 0 0 0  Afraid - awful might happen 0 0 0 0  Total GAD 7 Score 0 0 0 1  Anxiety Difficulty  Not difficult at all Not difficult at all Not difficult at all    Social History   Tobacco Use   Smoking status: Every Day    Current packs/day: 0.50    Average packs/day: 0.5 packs/day for 45.0 years (22.5 ttl pk-yrs)    Types: Cigarettes   Smokeless tobacco: Never   Tobacco comments:    down to 1-2 cig per week  Vaping Use   Vaping status: Never Used  Substance Use Topics   Alcohol use: Yes    Alcohol/week: 10.0 standard drinks of alcohol    Types: 10 Standard drinks or equivalent per week   Drug use: Never    Review of Systems Per HPI unless specifically indicated above  Objective:    BP 124/68   Pulse 66   Ht 5\' 10"  (1.778 m)   Wt 196 lb (88.9 kg)   BMI 28.12 kg/m   Wt Readings from Last 3 Encounters:  09/27/23 196 lb (88.9 kg)  09/09/23 198 lb 6.4 oz (90 kg)  04/15/23 196 lb 12.8 oz (89.3 kg)    Physical Exam Vitals and nursing note reviewed.  Constitutional:      General: He is not in acute distress.    Appearance: Normal appearance. He is well-developed. He is not diaphoretic.     Comments: Well-appearing, comfortable, cooperative  HENT:     Head: Normocephalic and atraumatic.  Eyes:     General:        Right eye: No discharge.        Left eye: No discharge.     Conjunctiva/sclera: Conjunctivae normal.  Cardiovascular:     Rate and Rhythm: Normal rate.  Pulmonary:     Effort: Pulmonary effort is normal.  Skin:    General: Skin is warm and dry.     Findings: No erythema or rash.  Neurological:     Mental Status: He is alert and  oriented to person, place, and time.  Psychiatric:        Mood and Affect: Mood normal.        Behavior: Behavior normal.        Thought Content: Thought content normal.     Comments: Well groomed, good eye contact, normal speech and thoughts     Results for orders placed or performed during the hospital encounter of 12/07/22  Basic metabolic panel   Collection Time: 12/07/22 11:09 AM  Result Value Ref Range   Sodium 139 135 - 145 mmol/L   Potassium 3.9 3.5 - 5.1 mmol/L   Chloride 108 98 - 111 mmol/L   CO2 22 22 - 32 mmol/L   Glucose, Bld 103 (H) 70 - 99 mg/dL   BUN 10 8 - 23 mg/dL   Creatinine, Ser 7.82 0.61 - 1.24 mg/dL   Calcium 8.6 (L) 8.9 - 10.3 mg/dL   GFR, Estimated >95 >62 mL/min   Anion gap 9 5 - 15  CBC   Collection Time: 12/07/22 11:09 AM  Result Value Ref Range   WBC 7.0 4.0 - 10.5 K/uL   RBC 5.59 4.22 - 5.81 MIL/uL   Hemoglobin 18.2 (H) 13.0 - 17.0 g/dL   HCT 13.0 (H) 86.5 - 78.4 %   MCV 96.4 80.0 - 100.0 fL   MCH 32.6 26.0 - 34.0 pg   MCHC 33.8 30.0 - 36.0 g/dL   RDW 69.6 29.5 - 28.4 %   Platelets 264 150 - 400 K/uL   nRBC 0.0 0.0 - 0.2 %  Troponin I (High Sensitivity)   Collection Time: 12/07/22 11:09 AM  Result Value Ref Range   Troponin I (High Sensitivity) 3 <18 ng/L  Troponin I (High Sensitivity)   Collection Time: 12/07/22  1:09 PM  Result Value Ref Range   Troponin I (High Sensitivity) 2 <18 ng/L      Assessment & Plan:   Problem List Items Addressed This Visit     Acquired hypothyroidism   Relevant Medications   levothyroxine (SYNTHROID) 200 MCG tablet   Chronic gout   Relevant Medications   allopurinol (ZYLOPRIM) 100 MG tablet   Chronic pain of both shoulders   Relevant Medications   clonazePAM (KLONOPIN) 0.5 MG tablet   cyclobenzaprine (FLEXERIL) 10 MG tablet  Generalized anxiety disorder with panic attacks   Relevant Medications   clonazePAM (KLONOPIN) 0.5 MG tablet   Other Visit Diagnoses       Essential hypertension        Relevant Medications   amLODipine (NORVASC) 10 MG tablet         Hypertension Relatively new diagnosis, now controlled. Improved control with Amlodipine 10mg  daily after initial adjustment period. No current symptoms of imbalance or falls. No fluid retention or swelling. -Continue Amlodipine 10mg  daily. -Consider future dose adjustment if well-controlled over time.  Medication Refills Patient requires refills for multiple medications including Clonazepam, Flexeril, and Amlodipine. Has sufficient supply of Allopurinol and Levothyroxine for the next two months. -Refill Clonazepam, Flexeril, and Amlodipine now. -Add future refills for Allopurinol and Levothyroxine, to be filled when needed.  Follow-up Next appointment scheduled for June 2025 with blood work to be done a week prior. -Schedule blood work for one week prior to June 2025 appointment. -Follow-up in clinic in June 2025.         No orders of the defined types were placed in this encounter.   Meds ordered this encounter  Medications   amLODipine (NORVASC) 10 MG tablet    Sig: Take 1 tablet (10 mg total) by mouth daily.    Dispense:  90 tablet    Refill:  3    Ready for fill now   allopurinol (ZYLOPRIM) 100 MG tablet    Sig: Take 2 tablets (200 mg total) by mouth daily.    Dispense:  180 tablet    Refill:  3    Add future refills on file. Patient currently not ready for new fill today   levothyroxine (SYNTHROID) 200 MCG tablet    Sig: Take 1 tablet (200 mcg total) by mouth daily before breakfast.    Dispense:  90 tablet    Refill:  3    Add future refills on file. Patient currently not ready for new fill today   clonazePAM (KLONOPIN) 0.5 MG tablet    Sig: TAKE 1 AND 1/2 TABLETS(0.75 MG) BY MOUTH TWICE DAILY    Dispense:  90 tablet    Refill:  2   cyclobenzaprine (FLEXERIL) 10 MG tablet    Sig: Take 1 tablet (10 mg total) by mouth at bedtime.    Dispense:  90 tablet    Refill:  1    Follow up plan: Return  in about 6 months (around 03/27/2024) for 6 month fasting lab > 1 week later Annual Physical.  Future labs ordered for 03/31/24   Saralyn Pilar, DO [facility] 09/27/2023, 8:41 AM

## 2023-09-27 NOTE — Patient Instructions (Addendum)
Thank you for coming to the office today.  All refills ordered.  Only 2 that I held were Allopurinol and Levothyroxine  The others should be ready for pick up soon  Clonazepam, Flexeril, Amlodipine  DUE for FASTING BLOOD WORK (no food or drink after midnight before the lab appointment, only water or coffee without cream/sugar on the morning of)  SCHEDULE "Lab Only" visit in the morning at the clinic for lab draw in 6 MONTHS   - Make sure Lab Only appointment is at about 1 week before your next appointment, so that results will be available  For Lab Results, once available within 2-3 days of blood draw, you can can log in to MyChart online to view your results and a brief explanation. Also, we can discuss results at next follow-up visit.    Please schedule a Follow-up Appointment to: Return in about 6 months (around 03/27/2024) for 6 month fasting lab > 1 week later Annual Physical.  If you have any other questions or concerns, please feel free to call the office or send a message through MyChart. You may also schedule an earlier appointment if necessary.  Additionally, you may be receiving a survey about your experience at our office within a few days to 1 week by e-mail or mail. We value your feedback.  Saralyn Pilar, DO [facility] [place], [place]

## 2024-01-02 ENCOUNTER — Other Ambulatory Visit: Payer: Self-pay | Admitting: Family Medicine

## 2024-01-02 DIAGNOSIS — F410 Panic disorder [episodic paroxysmal anxiety] without agoraphobia: Secondary | ICD-10-CM

## 2024-01-03 NOTE — Telephone Encounter (Signed)
 Requested medication (s) are due for refill today: yes  Requested medication (s) are on the active medication list: yes  Last refill:  09/27/23 #90/2  Future visit scheduled: yes  Notes to clinic:  Unable to refill per protocol, cannot delegate.    Requested Prescriptions  Pending Prescriptions Disp Refills   clonazePAM (KLONOPIN) 0.5 MG tablet [Pharmacy Med Name: CLONAZEPAM 0.5MG  TABLETS] 90 tablet     Sig: TAKE 1 AND 1/2 TABLETS(0.75 MG) BY MOUTH TWICE DAILY     Not Delegated - Psychiatry: Anxiolytics/Hypnotics 2 Failed - 01/03/2024  4:15 PM      Failed - This refill cannot be delegated      Failed - Urine Drug Screen completed in last 360 days      Failed - Valid encounter within last 6 months    Recent Outpatient Visits   None     Future Appointments             In 3 months Karamalegos, [name], DO [facility], [facility]            Passed - Patient is not pregnant

## 2024-02-05 ENCOUNTER — Ambulatory Visit: Payer: Self-pay | Admitting: *Deleted

## 2024-02-05 ENCOUNTER — Telehealth: Payer: Self-pay

## 2024-02-05 NOTE — Telephone Encounter (Signed)
 Copied from CRM [id number]. Topic: Appointments - Scheduling Inquiry for Clinic >> Feb 05, 2024  9:31 AM Jyl Or S wrote: Reason for CRM: Patient wants to change lab appt to the end of may or June 1 please call patient to reschedule

## 2024-02-05 NOTE — Telephone Encounter (Signed)
 Chief Complaint: Cough Symptoms: Mild productive cough, mild wheezing, mild SOB, congestion  Frequency: Constant  Pertinent Negatives: Patient denies fever, chest pain, headache Disposition: [] ED /[] Urgent Care (no appt availability in office) / [x] Appointment(In office/virtual)/ []  [facility]/ [] Home Care/ [] Refused Recommended Disposition /[] [facility]/ []  Follow-up with PCP Additional Notes: Patient states he has had a bad cough since Easter weekend that has improved since than but is not completely gone. He has a history of COPD as well. Patient reported on Friday while driving he had a coughing fit and had to pull over because he was seeing stars and passed out for a brief moment. Care advice was given and patient has an appointment scheduled with PCP tomorrow morning.   Copied from CRM [id number]. Topic: Clinical - Medical Advice >> Feb 05, 2024  9:24 AM Tiffany S wrote: Reason for CRM: Patient stated lately when he coughs sometimes he sees white stars he also stated that one time he coughed so much that he passed out patient not sure where this is coming from and is seeking medical advice please follow up with patient Reason for Disposition  [1] Continuous (nonstop) coughing interferes with work or school AND [2] no improvement using cough treatment per Care Advice  Answer Assessment - Initial Assessment Questions 1. ONSET: "When did the cough begin?"      Easter weekend  2. SEVERITY: "How bad is the cough today?"      Mild  3. SPUTUM: "Describe the color of your sputum" (none, dry cough; clear, white, yellow, green)     Green 4. HEMOPTYSIS: "Are you coughing up any blood?" If so ask: "How much?" (flecks, streaks, tablespoons, etc.)     No  5. DIFFICULTY BREATHING: "Are you having difficulty breathing?" If Yes, ask: "How bad is it?" (e.g., mild, moderate, severe)    - MILD: No SOB at rest, mild SOB with walking, speaks normally in sentences, can lie down, no  retractions, pulse < 100.    - MODERATE: SOB at rest, SOB with minimal exertion and prefers to sit, cannot lie down flat, speaks in phrases, mild retractions, audible wheezing, pulse 100-120.    - SEVERE: Very SOB at rest, speaks in single words, struggling to breathe, sitting hunched forward, retractions, pulse > 120      Mild  6. FEVER: "Do you have a fever?" If Yes, ask: "What is your temperature, how was it measured, and when did it start?"     No not today  7. CARDIAC HISTORY: "Do you have any history of heart disease?" (e.g., heart attack, congestive heart failure)      N/A 8. LUNG HISTORY: "Do you have any history of lung disease?"  (e.g., pulmonary embolus, asthma, emphysema)     COPD  9. PE RISK FACTORS: "Do you have a history of blood clots?" (or: recent major surgery, recent prolonged travel, bedridden)     No 10. OTHER SYMPTOMS: "Do you have any other symptoms?" (e.g., runny nose, wheezing, chest pain)       Fainted on Friday, seeing stars, congestion, wheezing  11. PREGNANCY: "Is there any chance you are pregnant?" "When was your last menstrual period?"       N/A 12. TRAVEL: "Have you traveled out of the country in the last month?" (e.g., travel history, exposures)       N/A  Protocols used: Cough - Acute Productive-A-AH

## 2024-02-05 NOTE — Telephone Encounter (Signed)
 Called patient to review sx of cough and other sx. No answer, LVMTCB [phone].      Copied from CRM [id number]. Topic: Clinical - Medical Advice >> Feb 05, 2024  9:24 AM Tiffany S wrote: Reason for CRM: Patient stated lately when he coughs sometimes he sees white stars he also stated that one time he coughed so much that he passed out patient not sure where this is coming from and is seeking medical advice please follow up with patient

## 2024-02-05 NOTE — Telephone Encounter (Signed)
 2nd call attempted to contact patient regarding sx of cough - seeing stars and has passed out before. No answer, LVMCTB [phone].

## 2024-02-06 ENCOUNTER — Encounter: Payer: Self-pay | Admitting: Family Medicine

## 2024-02-06 ENCOUNTER — Ambulatory Visit (INDEPENDENT_AMBULATORY_CARE_PROVIDER_SITE_OTHER): Payer: Self-pay | Admitting: Family Medicine

## 2024-02-06 VITALS — BP 130/80 | HR 65 | Ht 70.0 in | Wt 192.4 lb

## 2024-02-06 DIAGNOSIS — J441 Chronic obstructive pulmonary disease with (acute) exacerbation: Secondary | ICD-10-CM | POA: Diagnosis not present

## 2024-02-06 DIAGNOSIS — R55 Syncope and collapse: Secondary | ICD-10-CM | POA: Diagnosis not present

## 2024-02-06 DIAGNOSIS — N529 Male erectile dysfunction, unspecified: Secondary | ICD-10-CM

## 2024-02-06 DIAGNOSIS — J432 Centrilobular emphysema: Secondary | ICD-10-CM | POA: Diagnosis not present

## 2024-02-06 MED ORDER — BREZTRI AEROSPHERE 160-9-4.8 MCG/ACT IN AERO: 2.0000 | INHALATION_SPRAY | Freq: Two times a day (BID) | RESPIRATORY_TRACT | 11 refills | Status: AC

## 2024-02-06 MED ORDER — SILDENAFIL CITRATE 20 MG PO TABS: ORAL_TABLET | ORAL | 2 refills | Status: AC

## 2024-02-06 MED ORDER — PREDNISONE 20 MG PO TABS: ORAL_TABLET | ORAL | 0 refills | Status: DC

## 2024-02-06 MED ORDER — AZITHROMYCIN 250 MG PO TABS: ORAL_TABLET | ORAL | 0 refills | Status: DC

## 2024-02-06 NOTE — Progress Notes (Signed)
 Subjective:    Patient ID: Vincent Dunn, male    DOB: October 27, 1960, 63 y.o.   MRN: [id number]  Vincent Dunn is a 63 y.o. male presenting on 02/06/2024 for Cough   HPI  Discussed the use of AI scribe software for clinical note transcription with the patient, who gave verbal consent to proceed.  History of Present Illness   Vincent Dunn is a 63 year old male with COPD who presents with severe coughing spells and blackouts.  He has been experiencing severe coughing spells for approximately three weeks, beginning around the week before Easter. The cough is intense, occurring four to five times a night, and causes him to lose his breath, necessitating standing up to breathe. The severe coughing spell has led to one near syncopal episode, notably while driving to work, which he describes as 'pretty extreme'. No further episodes of syncope. He has felt anxiety due to persistent coughing fit.  He has been using over-the-counter medications such as Delsym, DayQuil, and NyQuil, which have provided some relief. Additionally, he used a leftover prescription cough syrup from his father, which helped temporarily. Despite these treatments, he continues to experience coughing fits, especially when he gets hot, and reports wheezing and shortness of breath.  He has a history of COPD and uses Breztri  and a rescue inhaler. Financial constraints affect his ability to consistently use Breztri  due to its high cost, despite having insurance through [employer]. He has been using the Breztri  sparingly, not adhering to the recommended dosage of two puffs twice a day, and has been using a nebulizer with Albuterol  treatments, which he found beneficial during severe episodes.  The coughing causing episodes of posttussive emesis and incontinence during severe coughing fits. He also notes that the coughing has been severe enough to cause him to cry and vomit.         02/06/2024    9:16 AM 09/27/2023     8:34 AM 09/09/2023   11:53 AM  Depression screen PHQ 2/9  Decreased Interest 0 0 0  Down, Depressed, Hopeless 0 0 0  PHQ - 2 Score 0 0 0  Altered sleeping 0 1 0  Tired, decreased energy 1 0 0  Change in appetite 0 0 0  Feeling bad or failure about yourself  0 0 0  Trouble concentrating 0 0 0  Moving slowly or fidgety/restless 0 0 0  Suicidal thoughts 0 0 0  PHQ-9 Score 1 1 0  Difficult doing work/chores Not difficult at all  Not difficult at all       02/06/2024    9:16 AM 09/27/2023    8:34 AM 09/09/2023   11:53 AM 04/15/2023    8:08 AM  GAD 7 : Generalized Anxiety Score  Nervous, Anxious, on Edge 0 0 0 0  Control/stop worrying 0 0 0 0  Worry too much - different things 0 0 0 0  Trouble relaxing 0 0 0 0  Restless 0 0 0 0  Easily annoyed or irritable 0 0 0 0  Afraid - awful might happen 0 0 0 0  Total GAD 7 Score 0 0 0 0  Anxiety Difficulty Not difficult at all  Not difficult at all Not difficult at all    Social History   Tobacco Use   Smoking status: Every Day    Current packs/day: 0.50    Average packs/day: 0.5 packs/day for 45.0 years (22.5 ttl pk-yrs)    Types: Cigarettes  Smokeless tobacco: Never   Tobacco comments:    down to 1-2 cig per week  Vaping Use   Vaping status: Never Used  Substance Use Topics   Alcohol use: Yes    Alcohol/week: 10.0 standard drinks of alcohol    Types: 10 Standard drinks or equivalent per week   Drug use: Never    Review of Systems Per HPI unless specifically indicated above     Objective:    BP 130/80 (BP Location: Right Arm, Patient Position: Sitting, Cuff Size: Normal)   Pulse 65   Ht 5\' 10"  (1.778 m)   Wt 192 lb 6 oz (87.3 kg)   SpO2 94%   BMI 27.60 kg/m   Wt Readings from Last 3 Encounters:  02/06/24 192 lb 6 oz (87.3 kg)  09/27/23 196 lb (88.9 kg)  09/09/23 198 lb 6.4 oz (90 kg)    Physical Exam Vitals and nursing note reviewed.  Constitutional:      General: He is not in acute distress.    Appearance: He  is well-developed. He is not diaphoretic.     Comments: Well-appearing, comfortable, cooperative  HENT:     Head: Normocephalic and atraumatic.  Eyes:     General:        Right eye: No discharge.        Left eye: No discharge.     Conjunctiva/sclera: Conjunctivae normal.  Neck:     Thyroid: No thyromegaly.  Cardiovascular:     Rate and Rhythm: Normal rate and regular rhythm.     Pulses: Normal pulses.     Heart sounds: Normal heart sounds. No murmur heard. Pulmonary:     Effort: Pulmonary effort is normal. No respiratory distress.     Breath sounds: Wheezing present. No rales.     Comments: cough Musculoskeletal:        General: Normal range of motion.     Cervical back: Normal range of motion and neck supple.  Lymphadenopathy:     Cervical: No cervical adenopathy.  Skin:    General: Skin is warm and dry.     Findings: No erythema or rash.  Neurological:     Mental Status: He is alert and oriented to person, place, and time. Mental status is at baseline.  Psychiatric:        Behavior: Behavior normal.     Comments: Well groomed, good eye contact, normal speech and thoughts     Results for orders placed or performed during the hospital encounter of 12/07/22  Basic metabolic panel   Collection Time: 12/07/22 11:09 AM  Result Value Ref Range   Sodium 139 135 - 145 mmol/L   Potassium 3.9 3.5 - 5.1 mmol/L   Chloride 108 98 - 111 mmol/L   CO2 22 22 - 32 mmol/L   Glucose, Bld 103 (H) 70 - 99 mg/dL   BUN 10 8 - 23 mg/dL   Creatinine, Ser 0.45 0.61 - 1.24 mg/dL   Calcium 8.6 (L) 8.9 - 10.3 mg/dL   GFR, Estimated >40 >98 mL/min   Anion gap 9 5 - 15  CBC   Collection Time: 12/07/22 11:09 AM  Result Value Ref Range   WBC 7.0 4.0 - 10.5 K/uL   RBC 5.59 4.22 - 5.81 MIL/uL   Hemoglobin 18.2 (H) 13.0 - 17.0 g/dL   HCT 11.9 (H) 14.7 - 82.9 %   MCV 96.4 80.0 - 100.0 fL   MCH 32.6 26.0 - 34.0 pg   MCHC 33.8 30.0 -  36.0 g/dL   RDW 16.1 09.6 - 04.5 %   Platelets 264 150 - 400  K/uL   nRBC 0.0 0.0 - 0.2 %  Troponin I (High Sensitivity)   Collection Time: 12/07/22 11:09 AM  Result Value Ref Range   Troponin I (High Sensitivity) 3 <18 ng/L  Troponin I (High Sensitivity)   Collection Time: 12/07/22  1:09 PM  Result Value Ref Range   Troponin I (High Sensitivity) 2 <18 ng/L      Assessment & Plan:   Problem List Items Addressed This Visit     Centrilobular emphysema (HCC)   Relevant Medications   BREZTRI  AEROSPHERE 160-9-4.8 MCG/ACT AERO inhaler   predniSONE  (DELTASONE ) 20 MG tablet   azithromycin  (ZITHROMAX  Z-PAK) 250 MG tablet   Other Visit Diagnoses       COPD with acute exacerbation (HCC)    -  Primary   Relevant Medications   BREZTRI  AEROSPHERE 160-9-4.8 MCG/ACT AERO inhaler   predniSONE  (DELTASONE ) 20 MG tablet   azithromycin  (ZITHROMAX  Z-PAK) 250 MG tablet     Vasovagal near syncope         Erectile dysfunction, unspecified erectile dysfunction type       Relevant Medications   sildenafil  (REVATIO ) 20 MG tablet        COPD exacerbation COPD exacerbation likely due to viral infection, causing severe coughing, dyspnea, and wheezing. Likely vasovagal syncopal episode from straining from severe coughing He is off of his maintenance therapy - Provide Breztri  samples for three weeks. - Order Breztri , instructing two puffs twice daily. - Prescribe 5-day azithromycin  and 7-day steroid taper. - Advise albuterol  use as needed for acute symptoms. - Discuss nebulizer use with albuterol  solution as needed. - Consider chest X-ray if symptoms persist. - Instruct on using discount cards for medication affordability. - He has previously been assisted by Clinical Pharmacy team in past, and confirmed his cost for Breztri  was $35 cap, however he states cost is higher but he is unsure last time he checked with pharmacy. - If he has high cost, he can contact us  and we can re-submit referral to [facility]  Erectile dysfunction Erectile dysfunction likely  due to antihypertensive medication. Sildenafil  prescribed with cost reduction via GoodRx coupon. Advised follow-up if ineffective. - Prescribe sildenafil  with instructions to take 20 mg as needed before intercourse, up to 5 tablets. - Provide a GoodRx coupon to reduce medication cost. - Advise follow-up if sildenafil  is ineffective, considering alternative treatments or urologist referral.  Follow-up Follow-up appointments scheduled for blood work and physical exam. - Ensure attendance at follow-up appointments on May 14th and May 21st.         No orders of the defined types were placed in this encounter.   Meds ordered this encounter  Medications   BREZTRI  AEROSPHERE 160-9-4.8 MCG/ACT AERO inhaler    Sig: Inhale 2 puffs into the lungs 2 (two) times daily.    Dispense:  10.7 g    Refill:  11   predniSONE  (DELTASONE ) 20 MG tablet    Sig: Take daily with food. Start with 60mg  (3 pills) x 2 days, then reduce to 40mg  (2 pills) x 2 days, then 20mg  (1 pill) x 3 days    Dispense:  13 tablet    Refill:  0   azithromycin  (ZITHROMAX  Z-PAK) 250 MG tablet    Sig: Take 2 tabs (500mg  total) on Day 1. Take 1 tab (250mg ) daily for next 4 days.    Dispense:  6 tablet  Refill:  0   sildenafil  (REVATIO ) 20 MG tablet    Sig: Take 1-5 pills about 30 min prior to sex. Start with 1 and increase as needed.    Dispense:  90 tablet    Refill:  2    [email or web address]    Follow up plan: Return if symptoms worsen or fail to improve.   Domingo Friend, DO [facility] 02/06/2024, 9:38 AM

## 2024-02-06 NOTE — Patient Instructions (Addendum)
 Thank you for coming to the office today.  Likely COPD Flare  Start Azithromycin  Z pak (antibiotic) 2 tabs day 1, then 1 tab x 4 days, complete entire course even if improved  Steroid taper   Use albuterol   Restart Breztri  2 puff twice a day, samples 1 per week  If it is not $0-$35 at the pharmacy, let me know we can call Timoteo Force again.  [email or web address]  Start Sildenafil  20mg  - take 1 tablet about 30 min prior to sexual intercourse for improved erection. If this dose does not work or is not strong enough NEXT TIME you can increase to 2 pills for 40mg . Maximum dose is 5 pills or 100mg , most people end up taking 3-4 pills per dose and this decision is up to you based on the results.  Once you take a dose, you have to wait 24 hours to repeat a dose.  You will need to use www.goodrx.com website or app on phone to enter "Sildenafil " 20mg  medication and "Get Free Coupon" option to save for [facility] once you select pharmacy and # of pills. Show that coupon to pharmacy. Otherwise it will cost hundreds of dollars.  Follow up if not working or new concerns we can refer you to a Urologist.   Please schedule a Follow-up Appointment to: Return if symptoms worsen or fail to improve.  If you have any other questions or concerns, please feel free to call the office or send a message through MyChart. You may also schedule an earlier appointment if necessary.  Additionally, you may be receiving a survey about your experience at our office within a few days to 1 week by e-mail or mail. We value your feedback.  Domingo Friend, DO [facility], [place]

## 2024-02-19 ENCOUNTER — Other Ambulatory Visit: Payer: Self-pay

## 2024-02-19 DIAGNOSIS — M1A09X0 Idiopathic chronic gout, multiple sites, without tophus (tophi): Secondary | ICD-10-CM

## 2024-02-19 DIAGNOSIS — Z125 Encounter for screening for malignant neoplasm of prostate: Secondary | ICD-10-CM

## 2024-02-19 DIAGNOSIS — E782 Mixed hyperlipidemia: Secondary | ICD-10-CM

## 2024-02-19 DIAGNOSIS — E039 Hypothyroidism, unspecified: Secondary | ICD-10-CM

## 2024-02-19 DIAGNOSIS — R7309 Other abnormal glucose: Secondary | ICD-10-CM

## 2024-02-19 DIAGNOSIS — D582 Other hemoglobinopathies: Secondary | ICD-10-CM

## 2024-02-20 LAB — LIPID PANEL
Cholesterol: 196 mg/dL (ref <200)
HDL: 45 mg/dL (ref >=40)
LDL Cholesterol (Calc): 125 mg/dL — ABNORMAL HIGH
Non-HDL Cholesterol (Calc): 151 mg/dL — ABNORMAL HIGH (ref <130)
Total CHOL/HDL Ratio: 4.4 (calc) (ref <5.0)
Triglycerides: 143 mg/dL (ref <150)

## 2024-02-20 LAB — COMPLETE METABOLIC PANEL WITHOUT GFR
AG Ratio: 2 (calc) (ref 1.0–2.5)
ALT: 32 U/L (ref 9–46)
AST: 24 U/L (ref 10–35)
Albumin: 4.3 g/dL (ref 3.6–5.1)
Alkaline phosphatase (APISO): 82 U/L (ref 35–144)
BUN: 8 mg/dL (ref 7–25)
CO2: 29 mmol/L (ref 20–32)
Calcium: 9.3 mg/dL (ref 8.6–10.3)
Chloride: 104 mmol/L (ref 98–110)
Creat: 0.91 mg/dL (ref 0.70–1.35)
Globulin: 2.2 g/dL (ref 1.9–3.7)
Glucose, Bld: 96 mg/dL (ref 65–99)
Potassium: 4.4 mmol/L (ref 3.5–5.3)
Sodium: 140 mmol/L (ref 135–146)
Total Bilirubin: 0.5 mg/dL (ref 0.2–1.2)
Total Protein: 6.5 g/dL (ref 6.1–8.1)

## 2024-02-20 LAB — CBC WITH DIFFERENTIAL/PLATELET
Absolute Lymphocytes: 1971 {cells}/uL (ref 850–3900)
Absolute Monocytes: 647 {cells}/uL (ref 200–950)
Basophils Absolute: 77 {cells}/uL (ref 0–200)
Basophils Relative: 1 %
Eosinophils Absolute: 177 {cells}/uL (ref 15–500)
Eosinophils Relative: 2.3 %
HCT: 53.9 % — ABNORMAL HIGH (ref 38.5–50.0)
Hemoglobin: 17.6 g/dL — ABNORMAL HIGH (ref 13.2–17.1)
MCH: 31.3 pg (ref 27.0–33.0)
MCHC: 32.7 g/dL (ref 32.0–36.0)
MCV: 95.7 fL (ref 80.0–100.0)
MPV: 10.6 fL (ref 7.5–12.5)
Monocytes Relative: 8.4 %
Neutro Abs: 4828 {cells}/uL (ref 1500–7800)
Neutrophils Relative %: 62.7 %
Platelets: 256 10*3/uL (ref 140–400)
RBC: 5.63 10*6/uL (ref 4.20–5.80)
RDW: 13.7 % (ref 11.0–15.0)
Total Lymphocyte: 25.6 %
WBC: 7.7 10*3/uL (ref 3.8–10.8)

## 2024-02-20 LAB — HEMOGLOBIN A1C
Hgb A1c MFr Bld: 5.6 % (ref <5.7)
Mean Plasma Glucose: 114 mg/dL
eAG (mmol/L): 6.3 mmol/L

## 2024-02-20 LAB — T4, FREE: Free T4: 1.8 ng/dL (ref 0.8–1.8)

## 2024-02-20 LAB — PSA: PSA: 0.84 ng/mL (ref <=4.00)

## 2024-02-20 LAB — TSH: TSH: 0.26 m[IU]/L — ABNORMAL LOW (ref 0.40–4.50)

## 2024-02-20 LAB — URIC ACID: Uric Acid, Serum: 6.5 mg/dL (ref 4.0–8.0)

## 2024-02-26 ENCOUNTER — Ambulatory Visit (INDEPENDENT_AMBULATORY_CARE_PROVIDER_SITE_OTHER): Payer: Self-pay | Admitting: Family Medicine

## 2024-02-26 ENCOUNTER — Encounter: Payer: Self-pay | Admitting: Family Medicine

## 2024-02-26 VITALS — BP 138/80 | HR 80 | Ht 70.0 in | Wt 197.0 lb

## 2024-02-26 DIAGNOSIS — E039 Hypothyroidism, unspecified: Secondary | ICD-10-CM | POA: Diagnosis not present

## 2024-02-26 DIAGNOSIS — F410 Panic disorder [episodic paroxysmal anxiety] without agoraphobia: Secondary | ICD-10-CM

## 2024-02-26 DIAGNOSIS — M1A09X0 Idiopathic chronic gout, multiple sites, without tophus (tophi): Secondary | ICD-10-CM

## 2024-02-26 DIAGNOSIS — F411 Generalized anxiety disorder: Secondary | ICD-10-CM

## 2024-02-26 DIAGNOSIS — J432 Centrilobular emphysema: Secondary | ICD-10-CM

## 2024-02-26 DIAGNOSIS — E782 Mixed hyperlipidemia: Secondary | ICD-10-CM

## 2024-02-26 MED ORDER — CLONAZEPAM 0.5 MG PO TABS: ORAL_TABLET | ORAL | 2 refills | Status: DC

## 2024-02-26 NOTE — Patient Instructions (Addendum)
 Thank you for coming to the office today.  Prevnar-20 pneumonia vaccine update at pharmacy  Recommend Low Dose Lung CT Screening - call to schedule or contact us  if need assistance.  BP slightly elevated Goal to monitor BP goal < 140 / 90  Refilled Clonazepam  today  Check other meds  Future consideration of Statin cholesterol medicine.  Please schedule a Follow-up Appointment to: Return in about 6 months (around 08/28/2024) for 6 month follow-up HTN, refills, HLD, COPD.  If you have any other questions or concerns, please feel free to call the office or send a message through MyChart. You may also schedule an earlier appointment if necessary.  Additionally, you may be receiving a survey about your experience at our office within a few days to 1 week by e-mail or mail. We value your feedback.  Domingo Friend, DO [facility], [place]

## 2024-02-26 NOTE — Progress Notes (Signed)
 Subjective:    Patient ID: Vincent Dunn, male    DOB: [birth year], 63 y.o.   MRN: [id number]  Vincent Dunn is a 63 y.o. male presenting on 02/26/2024 for COPD   HPI  Discussed the use of AI scribe software for clinical note transcription with the patient, who gave verbal consent to proceed.  History of Present Illness   Vincent Dunn is a 63 year old male who presents for an annual physical exam.  His A1c is 5.6, indicating a slight increase but not in the prediabetic range. He is managing this through lifestyle changes. His LDL cholesterol is 125, slightly increased over the past few years, but he is not on medication for it. He plans to improve his diet as he prepares to move back home and cook more meals.  Anxiety Controlled on Clonazepam  0.5mg  1.5 tab twice a day He is due for a refill of clonazepam , which he takes as needed, and his other medications are set to be refilled in December.   Centrilobular Emphysema / COPD He experienced a COPD flare-up a couple of weeks ago, likely due to a viral infection, with symptoms including near syncope with coughing. He has since improved, with only occasional dizziness when coughing. He has not yet picked up his inhaler prescription but plans to do so next week. He remains physically active daily with 2 jobs. Now has Breztri  inhaler coverage   Gout, chronic Continued on Allopurinol  100mg  x 2 = 200mg  daily, with good results, no gout flares   Hypothyroidism Continues on Levothyroxine  200mcg daily.  A1c 5.6, slight increase. Not in range of pre-diabetes  HYPERLIPIDEMIA: - Reports no concerns. Last lipid panel 02/2024, LDL 120s Not on statin. Declines Will improve diet  Hypertension, follow up Hypothyroidism Gout History   The patient, with a history of hypertension, presented for a follow-up visit to manage his blood pressure and refill medications. He reported that his blood pressure readings have been normal since starting  amlodipine  10mg . Initially, he experienced a "weird feeling" with the full dose, leading him to halve the dose for a week before returning to the full dose. He denied any episodes of imbalance or falls since starting the medication.   The patient also reported taking allopurinol  for gout and levothyroxine  for thyroid issues, with sufficient supply for two more months. He also takes clonazepam  and a muscle relaxant, the names of which were not specified in the conversation.   The patient denied any fluid retention or swelling in his feet or ankles. He also mentioned personal health issues, including a cancer scare and a diabetes misdiagnosis, which have since been resolved. He did not report any other symptoms or health concerns during this visit.    Health Maintenance:  PSA 0.84 negative  S/p Colonoscopy 09/02/23 Colonoscopy Dr Dellis Fermo [facility] - No polyps, repeat 10 years, 2034     02/26/2024    8:06 AM 02/06/2024    9:16 AM 09/27/2023    8:34 AM  Depression screen PHQ 2/9  Decreased Interest 0 0 0  Down, Depressed, Hopeless 0 0 0  PHQ - 2 Score 0 0 0  Altered sleeping 0 0 1  Tired, decreased energy 0 1 0  Change in appetite 0 0 0  Feeling bad or failure about yourself  0 0 0  Trouble concentrating 0 0 0  Moving slowly or fidgety/restless 0 0 0  Suicidal thoughts 0 0 0  PHQ-9 Score 0 1 1  Difficult doing work/chores Not difficult at all Not difficult at all        02/26/2024    8:06 AM 02/06/2024    9:16 AM 09/27/2023    8:34 AM 09/09/2023   11:53 AM  GAD 7 : Generalized Anxiety Score  Nervous, Anxious, on Edge 0 0 0 0  Control/stop worrying 0 0 0 0  Worry too much - different things 0 0 0 0  Trouble relaxing 0 0 0 0  Restless 0 0 0 0  Easily annoyed or irritable 0 0 0 0  Afraid - awful might happen 0 0 0 0  Total GAD 7 Score 0 0 0 0  Anxiety Difficulty  Not difficult at all  Not difficult at all     Past Medical History:  Diagnosis Date   Anxiety    Thyroid disease     History reviewed. No pertinent surgical history. Social History   Socioeconomic History   Marital status: Married    Spouse name: Not on file   Number of children: Not on file   Years of education: High School   Highest education level: High school graduate  Occupational History   Not on file  Tobacco Use   Smoking status: Every Day    Current packs/day: 0.50    Average packs/day: 0.5 packs/day for 45.0 years (22.5 ttl pk-yrs)    Types: Cigarettes   Smokeless tobacco: Never   Tobacco comments:    down to 1-2 cig per week  Vaping Use   Vaping status: Never Used  Substance and Sexual Activity   Alcohol use: Yes    Alcohol/week: 10.0 standard drinks of alcohol    Types: 10 Standard drinks or equivalent per week   Drug use: Never   Sexual activity: Not on file  Other Topics Concern   Not on file  Social History Narrative   Not on file   Social Drivers of Health   Financial Resource Strain: Not on file  Food Insecurity: Not on file  Transportation Needs: Not on file  Physical Activity: Not on file  Stress: Not on file  Social Connections: Not on file  Intimate Partner Violence: Not on file   Family History  Problem Relation Age of Onset   Cancer Mother        lung, brain   Heart disease Father    Dementia Sister    Current Outpatient Medications on File Prior to Visit  Medication Sig   albuterol  (VENTOLIN  HFA) 108 (90 Base) MCG/ACT inhaler Inhale 1-2 puffs into the lungs every 6 (six) hours as needed for wheezing or shortness of breath.   allopurinol  (ZYLOPRIM ) 100 MG tablet Take 2 tablets (200 mg total) by mouth daily.   amLODipine  (NORVASC ) 10 MG tablet Take 1 tablet (10 mg total) by mouth daily.   BREZTRI  AEROSPHERE 160-9-4.8 MCG/ACT AERO inhaler Inhale 2 puffs into the lungs 2 (two) times daily.   colchicine  0.6 MG tablet TAKE 1 TABLET BY MOUTH EVERY DAY AS NEEDED FOR GOUT FLARE. REPEAT 1 PILL 2 HOURS LATER IF NEEDED. THEN CONTINUE DAILY FOR UP TO 3-5 DAYS AS  NEEDED   cyclobenzaprine  (FLEXERIL ) 10 MG tablet Take 1 tablet (10 mg total) by mouth at bedtime.   levothyroxine  (SYNTHROID ) 200 MCG tablet Take 1 tablet (200 mcg total) by mouth daily before breakfast.   sildenafil  (REVATIO ) 20 MG tablet Take 1-5 pills about 30 min prior to sex. Start with 1 and increase as needed.   No  current facility-administered medications on file prior to visit.    Review of Systems  Constitutional:  Negative for activity change, appetite change, chills, diaphoresis, fatigue and fever.  HENT:  Negative for congestion and hearing loss.   Eyes:  Negative for visual disturbance.  Respiratory:  Negative for cough, chest tightness, shortness of breath and wheezing.   Cardiovascular:  Negative for chest pain, palpitations and leg swelling.  Gastrointestinal:  Negative for abdominal pain, constipation, diarrhea, nausea and vomiting.  Genitourinary:  Negative for dysuria, frequency and hematuria.  Musculoskeletal:  Negative for arthralgias and neck pain.  Skin:  Negative for rash.  Neurological:  Negative for dizziness, weakness, light-headedness, numbness and headaches.  Hematological:  Negative for adenopathy.  Psychiatric/Behavioral:  Negative for behavioral problems, dysphoric mood and sleep disturbance.    Per HPI unless specifically indicated above     Objective:     BP 138/80 (BP Location: Left Arm, Cuff Size: Normal)   Pulse 80   Ht 5\' 10"  (1.778 m)   Wt 197 lb (89.4 kg)   BMI 28.27 kg/m   Wt Readings from Last 3 Encounters:  02/26/24 197 lb (89.4 kg)  02/06/24 192 lb 6 oz (87.3 kg)  09/27/23 196 lb (88.9 kg)    Physical Exam Vitals and nursing note reviewed.  Constitutional:      General: He is not in acute distress.    Appearance: He is well-developed. He is not diaphoretic.     Comments: Well-appearing, comfortable, cooperative  HENT:     Head: Normocephalic and atraumatic.  Eyes:     General:        Right eye: No discharge.        Left  eye: No discharge.     Conjunctiva/sclera: Conjunctivae normal.     Pupils: Pupils are equal, round, and reactive to light.  Neck:     Thyroid: No thyromegaly.  Cardiovascular:     Rate and Rhythm: Normal rate and regular rhythm.     Pulses: Normal pulses.     Heart sounds: Normal heart sounds. No murmur heard. Pulmonary:     Effort: Pulmonary effort is normal. No respiratory distress.     Breath sounds: Normal breath sounds. No wheezing or rales.     Comments: Much improved lung sounds Abdominal:     General: Bowel sounds are normal. There is no distension.     Palpations: Abdomen is soft. There is no mass.     Tenderness: There is no abdominal tenderness.  Musculoskeletal:        General: No tenderness. Normal range of motion.     Cervical back: Normal range of motion and neck supple.     Comments: Upper / Lower Extremities: - Normal muscle tone, strength bilateral upper extremities 5/5, lower extremities 5/5  Lymphadenopathy:     Cervical: No cervical adenopathy.  Skin:    General: Skin is warm and dry.     Findings: No erythema or rash.  Neurological:     Mental Status: He is alert and oriented to person, place, and time.     Comments: Distal sensation intact to light touch all extremities  Psychiatric:        Mood and Affect: Mood normal.        Behavior: Behavior normal.        Thought Content: Thought content normal.     Comments: Well groomed, good eye contact, normal speech and thoughts     Results for orders placed or performed  in visit on 02/19/24  Uric acid   Collection Time: 02/19/24  7:52 AM  Result Value Ref Range   Uric Acid, Serum 6.5 4.0 - 8.0 mg/dL  T4, free   Collection Time: 02/19/24  7:52 AM  Result Value Ref Range   Free T4 1.8 0.8 - 1.8 ng/dL  TSH   Collection Time: 02/19/24  7:52 AM  Result Value Ref Range   TSH 0.26 (L) 0.40 - 4.50 mIU/L  PSA   Collection Time: 02/19/24  7:52 AM  Result Value Ref Range   PSA 0.84 < OR = 4.00 ng/mL   COMPLETE METABOLIC PANEL WITH GFR   Collection Time: 02/19/24  7:52 AM  Result Value Ref Range   Glucose, Bld 96 65 - 99 mg/dL   BUN 8 7 - 25 mg/dL   Creat 1.30 8.65 - 7.84 mg/dL   BUN/Creatinine Ratio SEE NOTE: 6 - 22 (calc)   Sodium 140 135 - 146 mmol/L   Potassium 4.4 3.5 - 5.3 mmol/L   Chloride 104 98 - 110 mmol/L   CO2 29 20 - 32 mmol/L   Calcium 9.3 8.6 - 10.3 mg/dL   Total Protein 6.5 6.1 - 8.1 g/dL   Albumin 4.3 3.6 - 5.1 g/dL   Globulin 2.2 1.9 - 3.7 g/dL (calc)   AG Ratio 2.0 1.0 - 2.5 (calc)   Total Bilirubin 0.5 0.2 - 1.2 mg/dL   Alkaline phosphatase (APISO) 82 35 - 144 U/L   AST 24 10 - 35 U/L   ALT 32 9 - 46 U/L  CBC with Differential/Platelet   Collection Time: 02/19/24  7:52 AM  Result Value Ref Range   WBC 7.7 3.8 - 10.8 Thousand/uL   RBC 5.63 4.20 - 5.80 Million/uL   Hemoglobin 17.6 (H) 13.2 - 17.1 g/dL   HCT 69.6 (H) 29.5 - 28.4 %   MCV 95.7 80.0 - 100.0 fL   MCH 31.3 27.0 - 33.0 pg   MCHC 32.7 32.0 - 36.0 g/dL   RDW 13.2 44.0 - 10.2 %   Platelets 256 140 - 400 Thousand/uL   MPV 10.6 7.5 - 12.5 fL   Neutro Abs 4,828 1,500 - 7,800 cells/uL   Absolute Lymphocytes 1,971 850 - 3,900 cells/uL   Absolute Monocytes 647 200 - 950 cells/uL   Eosinophils Absolute 177 15 - 500 cells/uL   Basophils Absolute 77 0 - 200 cells/uL   Neutrophils Relative % 62.7 %   Total Lymphocyte 25.6 %   Monocytes Relative 8.4 %   Eosinophils Relative 2.3 %   Basophils Relative 1.0 %  Lipid panel   Collection Time: 02/19/24  7:52 AM  Result Value Ref Range   Cholesterol 196 <200 mg/dL   HDL 45 > OR = 40 mg/dL   Triglycerides 725 <366 mg/dL   LDL Cholesterol (Calc) 125 (H) mg/dL (calc)   Total CHOL/HDL Ratio 4.4 <5.0 (calc)   Non-HDL Cholesterol (Calc) 151 (H) <130 mg/dL (calc)  Hemoglobin Y4I   Collection Time: 02/19/24  7:52 AM  Result Value Ref Range   Hgb A1c MFr Bld 5.6 <5.7 %   Mean Plasma Glucose 114 mg/dL   eAG (mmol/L) 6.3 mmol/L      Assessment & Plan:    Problem List Items Addressed This Visit     Acquired hypothyroidism   Centrilobular emphysema (HCC) - Primary   Chronic gout   Generalized anxiety disorder with panic attacks   Relevant Medications   clonazePAM  (KLONOPIN ) 0.5 MG tablet  Mixed hyperlipidemia     Updated Health Maintenance information Reviewed recent lab results with patient Encouraged improvement to lifestyle with diet and exercise Goal of weight loss  Chronic Obstructive Pulmonary Disease (COPD) Recent exacerbation likely viral. Symptoms improved, no recent syncope. Inhaler cost manageable. - Continue current inhaler regimen. Breztri  2 puff TWICE A DAY, and Albuterol  PRN  Thyroid function abnormality TSH low, T4 borderline high. Current levothyroxine  dosage may be excessive. No significant symptoms, prefers current dosage. - Continue levothyroxine  200 mcg daily. - Monitor thyroid function in future labs.  Hypertension Blood pressure slightly elevated at 142 systolic. Plans dietary improvements. - Monitor blood pressure at home. - Implement dietary changes to improve blood pressure.  General Health Maintenance Prevnar 20 recommended. Colonoscopy in 2024 showed no polyps; repeat in 10 years. Annual lung screening recommended. A1c 5.6, LDL 125, 20% 10-year cardiovascular risk. Plans dietary improvements, declines statin. - Receive Prevnar 20 pneumonia vaccine at pharmacy. - Schedule annual lung screening. - Monitor A1c and cholesterol levels. - Implement dietary changes to improve cholesterol.  Medication Management Refills needed for clonazepam . Other medications current until December. - Refill clonazepam  at [facility].         No orders of the defined types were placed in this encounter.   Meds ordered this encounter  Medications   clonazePAM  (KLONOPIN ) 0.5 MG tablet    Sig: TAKE 1 AND 1/2 TABLETS(0.75 MG) BY MOUTH TWICE DAILY    Dispense:  90 tablet    Refill:  2     Follow up  plan: Return in about 6 months (around 08/28/2024) for 6 month follow-up HTN, refills, HLD, COPD.  Domingo Friend, DO [facility] 02/26/2024, 8:45 AM

## 2024-03-08 ENCOUNTER — Other Ambulatory Visit: Payer: Self-pay | Admitting: Family Medicine

## 2024-03-08 DIAGNOSIS — G8929 Other chronic pain: Secondary | ICD-10-CM

## 2024-03-09 NOTE — Telephone Encounter (Signed)
 Requested medications are due for refill today.  yes  Requested medications are on the active medications list.  yes  Last refill. 09/27/2023 #90 1 rf  Future visit scheduled.   yes  Notes to clinic.  Refill not delegated.    Requested Prescriptions  Pending Prescriptions Disp Refills   cyclobenzaprine  (FLEXERIL ) 10 MG tablet [Pharmacy Med Name: CYCLOBENZAPRINE  10MG  TABLETS] 90 tablet 1    Sig: TAKE 1 TABLET(10 MG) BY MOUTH AT BEDTIME     Not Delegated - Analgesics:  Muscle Relaxants Failed - 03/09/2024  5:16 PM      Failed - This refill cannot be delegated      Passed - Valid encounter within last 6 months    Recent Outpatient Visits           1 week ago Centrilobular emphysema [facility])   [place] [facility] [place], Kayleen Party, DO   1 month ago COPD with acute exacerbation [facility])   [place] [facility] [place], Kayleen Party, [place]

## 2024-03-31 ENCOUNTER — Other Ambulatory Visit: Payer: Self-pay

## 2024-04-09 ENCOUNTER — Ambulatory Visit: Payer: Self-pay | Admitting: Family Medicine

## 2024-07-19 ENCOUNTER — Other Ambulatory Visit: Payer: Self-pay | Admitting: Family Medicine

## 2024-07-19 DIAGNOSIS — F410 Panic disorder [episodic paroxysmal anxiety] without agoraphobia: Secondary | ICD-10-CM

## 2024-07-21 NOTE — Telephone Encounter (Signed)
 Requested medication (s) are due for refill today: yes  Requested medication (s) are on the active medication list: yes  Last refill:  02/26/24  Future visit scheduled: no  Notes to clinic:  Unable to refill per protocol, cannot delegate.      Requested Prescriptions  Pending Prescriptions Disp Refills   clonazePAM  (KLONOPIN ) 0.5 MG tablet [Pharmacy Med Name: CLONAZEPAM  0.5MG  TABLETS] 90 tablet     Sig: TAKE 1 AND 1/2 TABLETS(0.75 MG) BY MOUTH TWICE DAILY     Not Delegated - Psychiatry: Anxiolytics/Hypnotics 2 Failed - 07/21/2024  3:28 PM      Failed - This refill cannot be delegated      Failed - Urine Drug Screen completed in last 360 days      Passed - Patient is not pregnant      Passed - Valid encounter within last 6 months    Recent Outpatient Visits           4 months ago Centrilobular emphysema [facility])   [place] [facility] [place], Marsa PARAS, DO   5 months ago COPD with acute exacerbation [facility])   [place] [facility] [place], Marsa PARAS, [place]

## 2024-08-28 ENCOUNTER — Ambulatory Visit: Payer: Self-pay | Admitting: Family Medicine

## 2024-09-01 ENCOUNTER — Ambulatory Visit (INDEPENDENT_AMBULATORY_CARE_PROVIDER_SITE_OTHER): Payer: Self-pay | Admitting: Family Medicine

## 2024-09-01 ENCOUNTER — Other Ambulatory Visit: Payer: Self-pay | Admitting: Family Medicine

## 2024-09-01 ENCOUNTER — Encounter: Payer: Self-pay | Admitting: Family Medicine

## 2024-09-01 VITALS — BP 138/70 | HR 77 | Ht 70.0 in | Wt 197.2 lb

## 2024-09-01 DIAGNOSIS — R7309 Other abnormal glucose: Secondary | ICD-10-CM

## 2024-09-01 DIAGNOSIS — M25511 Pain in right shoulder: Secondary | ICD-10-CM

## 2024-09-01 DIAGNOSIS — E782 Mixed hyperlipidemia: Secondary | ICD-10-CM

## 2024-09-01 DIAGNOSIS — M1A09X0 Idiopathic chronic gout, multiple sites, without tophus (tophi): Secondary | ICD-10-CM

## 2024-09-01 DIAGNOSIS — F410 Panic disorder [episodic paroxysmal anxiety] without agoraphobia: Secondary | ICD-10-CM

## 2024-09-01 DIAGNOSIS — M1A0710 Idiopathic chronic gout, right ankle and foot, without tophus (tophi): Secondary | ICD-10-CM

## 2024-09-01 DIAGNOSIS — M25512 Pain in left shoulder: Secondary | ICD-10-CM

## 2024-09-01 DIAGNOSIS — F411 Generalized anxiety disorder: Secondary | ICD-10-CM | POA: Diagnosis not present

## 2024-09-01 DIAGNOSIS — E039 Hypothyroidism, unspecified: Secondary | ICD-10-CM | POA: Diagnosis not present

## 2024-09-01 DIAGNOSIS — J432 Centrilobular emphysema: Secondary | ICD-10-CM

## 2024-09-01 DIAGNOSIS — Z125 Encounter for screening for malignant neoplasm of prostate: Secondary | ICD-10-CM

## 2024-09-01 DIAGNOSIS — Z0000 Encounter for general adult medical examination without abnormal findings: Secondary | ICD-10-CM

## 2024-09-01 DIAGNOSIS — I10 Essential (primary) hypertension: Secondary | ICD-10-CM

## 2024-09-01 DIAGNOSIS — G8929 Other chronic pain: Secondary | ICD-10-CM

## 2024-09-01 MED ORDER — LEVOTHYROXINE SODIUM 200 MCG PO TABS: 200.0000 ug | ORAL_TABLET | Freq: Every day | ORAL | 3 refills | Status: AC

## 2024-09-01 MED ORDER — ALLOPURINOL 100 MG PO TABS
200.0000 mg | ORAL_TABLET | Freq: Every day | ORAL | 3 refills | Status: AC
Start: 2024-09-01 — End: ?

## 2024-09-01 MED ORDER — AMLODIPINE BESYLATE 10 MG PO TABS: 10.0000 mg | ORAL_TABLET | Freq: Every day | ORAL | 3 refills | Status: AC

## 2024-09-01 MED ORDER — CYCLOBENZAPRINE HCL 10 MG PO TABS: 10.0000 mg | ORAL_TABLET | Freq: Every day | ORAL | 1 refills | Status: AC

## 2024-09-01 NOTE — Progress Notes (Signed)
 Subjective:    Patient ID: Vincent Dunn, male    DOB: Sep 18, 1961, 63 y.o.   MRN: [id number]  Vincent Dunn is a 63 y.o. male presenting on 09/01/2024 for Medical Management of Chronic Issues   HPI  Discussed the use of AI scribe software for clinical note transcription with the patient, who gave verbal consent to proceed.  History of Present Illness   Vincent Dunn is a 63 year old male with COPD who presents for medication refills and management of his respiratory symptoms.  Centrilobular Emphysema Respiratory symptoms and chronic obstructive pulmonary disease (copd) - Severe coughing spells leading to blackouts, occurring approximately six times in the past month - One episode resulted in loss of consciousness while watching a ball game - Significant wheezing episodes - Uses Breztri  and albuterol  inhalers for COPD management - Accesses albuterol  inhalers with assistance from daughter and granddaughter; granddaughter uses inhaler as needed - Utilizes a nebulizer for albuterol  treatments, provided by a neighbor - Avoids exposure to smoke due to symptom exacerbation - History of smoking for fifty years - Not following Pulmonology but lack of Psychologist, [facility] and insurance barriers - Retired and on [employer] - Corporate treasurer impact ability to pay for medical services and medications - Currently without insurance coverage and working to resolve this issue Awaiting coverage    Anxiety Controlled on Clonazepam  0.5mg  1.5 tab twice a day   Gout, chronic Continued on Allopurinol  100mg  x 2 = 200mg  daily, with good results, no gout flares   Hypothyroidism Continues on Levothyroxine  daily.   Hypertension Controlled on current medication    Health Maintenance: Prevnar-20 due at pharmacy     02/26/2024    8:06 AM 02/06/2024    9:16 AM 09/27/2023    8:34 AM  Depression screen PHQ 2/9  Decreased Interest 0 0 0  Down, Depressed, Hopeless 0 0 0  PHQ - 2  Score 0 0 0  Altered sleeping 0 0 1  Tired, decreased energy 0 1 0  Change in appetite 0 0 0  Feeling bad or failure about yourself  0 0 0  Trouble concentrating 0 0 0  Moving slowly or fidgety/restless 0 0 0  Suicidal thoughts 0 0 0  PHQ-9 Score 0  1  1   Difficult doing work/chores Not difficult at all Not difficult at all      Data saved with a previous flowsheet row definition       02/26/2024    8:06 AM 02/06/2024    9:16 AM 09/27/2023    8:34 AM 09/09/2023   11:53 AM  GAD 7 : Generalized Anxiety Score  Nervous, Anxious, on Edge 0 0 0 0  Control/stop worrying 0 0 0 0  Worry too much - different things 0 0 0 0  Trouble relaxing 0 0 0 0  Restless 0 0 0 0  Easily annoyed or irritable 0 0 0 0  Afraid - awful might happen 0 0 0 0  Total GAD 7 Score 0 0 0 0  Anxiety Difficulty  Not difficult at all  Not difficult at all    Social History   Tobacco Use   Smoking status: Every Day    Current packs/day: 0.50    Average packs/day: 0.5 packs/day for 45.0 years (22.5 ttl pk-yrs)    Types: Cigarettes   Smokeless tobacco: Never   Tobacco comments:    down to 1-2 cig per week  Vaping Use   Vaping status: Never  Used  Substance Use Topics   Alcohol use: Yes    Alcohol/week: 10.0 standard drinks of alcohol    Types: 10 Standard drinks or equivalent per week   Drug use: Never    Review of Systems Per HPI unless specifically indicated above     Objective:    BP 138/70 (BP Location: Left Arm, Cuff Size: Normal)   Pulse 77   Ht 5' 10 (1.778 m)   Wt 197 lb 4 oz (89.5 kg)   SpO2 98%   BMI 28.30 kg/m   Wt Readings from Last 3 Encounters:  09/01/24 197 lb 4 oz (89.5 kg)  02/26/24 197 lb (89.4 kg)  02/06/24 192 lb 6 oz (87.3 kg)    Physical Exam Vitals and nursing note reviewed.  Constitutional:      General: He is not in acute distress.    Appearance: He is well-developed. He is not diaphoretic.     Comments: Well-appearing, comfortable, cooperative  HENT:      Head: Normocephalic and atraumatic.  Eyes:     General:        Right eye: No discharge.        Left eye: No discharge.     Conjunctiva/sclera: Conjunctivae normal.  Neck:     Thyroid: No thyromegaly.  Cardiovascular:     Rate and Rhythm: Normal rate and regular rhythm.     Pulses: Normal pulses.     Heart sounds: Normal heart sounds. No murmur heard. Pulmonary:     Effort: Pulmonary effort is normal. No respiratory distress.     Breath sounds: Wheezing present. No rales.     Comments: Mild coarse breath sounds, reduced air movement at baseline Musculoskeletal:        General: Normal range of motion.     Cervical back: Normal range of motion and neck supple.  Lymphadenopathy:     Cervical: No cervical adenopathy.  Skin:    General: Skin is warm and dry.     Findings: No erythema or rash.  Neurological:     Mental Status: He is alert and oriented to person, place, and time. Mental status is at baseline.  Psychiatric:        Behavior: Behavior normal.     Comments: Well groomed, good eye contact, normal speech and thoughts       Results for orders placed or performed in visit on 02/19/24  Uric acid   Collection Time: 02/19/24  7:52 AM  Result Value Ref Range   Uric Acid, Serum 6.5 4.0 - 8.0 mg/dL  T4, free   Collection Time: 02/19/24  7:52 AM  Result Value Ref Range   Free T4 1.8 0.8 - 1.8 ng/dL  TSH   Collection Time: 02/19/24  7:52 AM  Result Value Ref Range   TSH 0.26 (L) 0.40 - 4.50 mIU/L  PSA   Collection Time: 02/19/24  7:52 AM  Result Value Ref Range   PSA 0.84 < OR = 4.00 ng/mL  COMPLETE METABOLIC PANEL WITH GFR   Collection Time: 02/19/24  7:52 AM  Result Value Ref Range   Glucose, Bld 96 65 - 99 mg/dL   BUN 8 7 - 25 mg/dL   Creat 9.08 9.29 - 8.64 mg/dL   BUN/Creatinine Ratio SEE NOTE: 6 - 22 (calc)   Sodium 140 135 - 146 mmol/L   Potassium 4.4 3.5 - 5.3 mmol/L   Chloride 104 98 - 110 mmol/L   CO2 29 20 - 32 mmol/L  Calcium 9.3 8.6 - 10.3 mg/dL    Total Protein 6.5 6.1 - 8.1 g/dL   Albumin 4.3 3.6 - 5.1 g/dL   Globulin 2.2 1.9 - 3.7 g/dL (calc)   AG Ratio 2.0 1.0 - 2.5 (calc)   Total Bilirubin 0.5 0.2 - 1.2 mg/dL   Alkaline phosphatase (APISO) 82 35 - 144 U/L   AST 24 10 - 35 U/L   ALT 32 9 - 46 U/L  CBC with Differential/Platelet   Collection Time: 02/19/24  7:52 AM  Result Value Ref Range   WBC 7.7 3.8 - 10.8 Thousand/uL   RBC 5.63 4.20 - 5.80 Million/uL   Hemoglobin 17.6 (H) 13.2 - 17.1 g/dL   HCT 46.0 (H) 61.4 - 49.9 %   MCV 95.7 80.0 - 100.0 fL   MCH 31.3 27.0 - 33.0 pg   MCHC 32.7 32.0 - 36.0 g/dL   RDW 86.2 88.9 - 84.9 %   Platelets 256 140 - 400 Thousand/uL   MPV 10.6 7.5 - 12.5 fL   Neutro Abs 4,828 1,500 - 7,800 cells/uL   Absolute Lymphocytes 1,971 850 - 3,900 cells/uL   Absolute Monocytes 647 200 - 950 cells/uL   Eosinophils Absolute 177 15 - 500 cells/uL   Basophils Absolute 77 0 - 200 cells/uL   Neutrophils Relative % 62.7 %   Total Lymphocyte 25.6 %   Monocytes Relative 8.4 %   Eosinophils Relative 2.3 %   Basophils Relative 1.0 %  Lipid panel   Collection Time: 02/19/24  7:52 AM  Result Value Ref Range   Cholesterol 196 <200 mg/dL   HDL 45 > OR = 40 mg/dL   Triglycerides 856 <849 mg/dL   LDL Cholesterol (Calc) 125 (H) mg/dL (calc)   Total CHOL/HDL Ratio 4.4 <5.0 (calc)   Non-HDL Cholesterol (Calc) 151 (H) <130 mg/dL (calc)  Hemoglobin J8r   Collection Time: 02/19/24  7:52 AM  Result Value Ref Range   Hgb A1c MFr Bld 5.6 <5.7 %   Mean Plasma Glucose 114 mg/dL   eAG (mmol/L) 6.3 mmol/L      Assessment & Plan:   Problem List Items Addressed This Visit     Acquired hypothyroidism   Relevant Medications   levothyroxine  (SYNTHROID ) 200 MCG tablet   Centrilobular emphysema (HCC) - Primary   Chronic gout   Relevant Medications   allopurinol  (ZYLOPRIM ) 100 MG tablet   Chronic pain of both shoulders   Relevant Medications   cyclobenzaprine  (FLEXERIL ) 10 MG tablet   Generalized anxiety  disorder with panic attacks   Mixed hyperlipidemia   Relevant Medications   amLODipine  (NORVASC ) 10 MG tablet   Other Visit Diagnoses       Essential hypertension       Relevant Medications   amLODipine  (NORVASC ) 10 MG tablet        Centrilobular emphysema (COPD) Severe COPD with syncope-inducing cough, indicating significant respiratory compromise. On maximum Breztri  therapy. Requires pulmonologist evaluation. Financial and insurance barriers noted. - Provided two Breztri  inhalers. - Recommended pulmonologist referral. He declines today due to insurance network and coverage / cost - Advised Prevnar 20 vaccine at pharmacy.  Essential hypertension Hypertension managed with amlodipine . - Reordered amlodipine .  Idiopathic chronic gout Chronic gout managed with allopurinol  prevention - Reordered allopurinol . Future uric acid with labs  Hypothyroidism Previously controlled Managed with levothyroxine . 200mcg daily - Reordered levothyroxine .  Generalized anxiety disorder Chronic management on BDZ Clonazepam . - Continue Klonopin  regimen. With refills available.  General Health Maintenance Eligible for pneumonia  vaccination update. - Advised Prevnar 20 vaccine at pharmacy.        No orders of the defined types were placed in this encounter.   Meds ordered this encounter  Medications   levothyroxine  (SYNTHROID ) 200 MCG tablet    Sig: Take 1 tablet (200 mcg total) by mouth daily before breakfast.    Dispense:  90 tablet    Refill:  3   amLODipine  (NORVASC ) 10 MG tablet    Sig: Take 1 tablet (10 mg total) by mouth daily.    Dispense:  90 tablet    Refill:  3   allopurinol  (ZYLOPRIM ) 100 MG tablet    Sig: Take 2 tablets (200 mg total) by mouth daily.    Dispense:  180 tablet    Refill:  3   cyclobenzaprine  (FLEXERIL ) 10 MG tablet    Sig: Take 1 tablet (10 mg total) by mouth at bedtime.    Dispense:  90 tablet    Refill:  1    Follow up plan: Return for 6 month  fasting lab > 1 week later Annual Physical.  Future labs ordered for 02/24/25   Marsa Officer, DO [facility] 09/01/2024, 3:04 PM

## 2024-09-01 NOTE — Patient Instructions (Addendum)
 Thank you for coming to the office today.  Breztri  x 2 samples  I do strongly recommend Pulmonologist.  If you stay with [facility] - it would be [place] Pul[facility]ologist referral.  Or if you need other locations - [employer] or [address] or [place] in [place].  [place] Pneumonia vaccine at the pharmacy 1 dose good for 5 years.  DUE for FASTING BLOOD WORK (no food or drink after midnight before the lab appointment, only water or coffee without cream/sugar on the morning of)  SCHEDULE Lab Only visit in the morning at the clinic for lab draw in 6 MONTHS   - Make sure Lab Only appointment is at about 1 week before your next appointment, so that results will be available  For Lab Results, once available within 2-3 days of blood draw, you can can log in to MyChart online to view your results and a brief explanation. Also, we can discuss results at next follow-up visit.   Please schedule a Follow-up Appointment to: Return for 6 month fasting lab > 1 week later Annual Physical.  If you have any other questions or concerns, please feel free to call the office or send a message through MyChart. You may also schedule an earlier appointment if necessary.  Additionally, you may be receiving a survey about your experience at our office within a few days to 1 week by e-mail or mail. We value your feedback.  Marsa Officer, DO [facility], [place]

## 2024-10-16 ENCOUNTER — Other Ambulatory Visit: Payer: Self-pay | Admitting: Family Medicine

## 2024-10-16 DIAGNOSIS — F410 Panic disorder [episodic paroxysmal anxiety] without agoraphobia: Secondary | ICD-10-CM

## 2024-10-19 NOTE — Telephone Encounter (Signed)
 Requested medications are due for refill today.  yes  Requested medications are on the active medications list.  yes  Last refill. 07/21/2024 #90 2 rf  Future visit scheduled.   yes  Notes to clinic.  Refill not delegated.    Requested Prescriptions  Pending Prescriptions Disp Refills   clonazePAM  (KLONOPIN ) 0.5 MG tablet [Pharmacy Med Name: CLONAZEPAM  0.5MG  TABLETS] 90 tablet     Sig: TAKE 1 AND 1/2 TABLETS(0.75 MG) BY MOUTH TWICE DAILY     Not Delegated - Psychiatry: Anxiolytics/Hypnotics 2 Failed - 10/19/2024 12:18 PM      Failed - This refill cannot be delegated      Failed - Urine Drug Screen completed in last 360 days      Passed - Patient is not pregnant      Passed - Valid encounter within last 6 months    Recent Outpatient Visits           1 month ago Centrilobular emphysema [facility])   [place] [facility] [place] Marsa PARAS, [place]   7 months ago Centrilobular emphysema [facility])   [place] [facility] [place]a PARAS, [place]   8 months ago COPD with acute exacerbation [facility])   [place] [facility] [place], Marsa PARAS, [place]

## 2024-10-23 ENCOUNTER — Other Ambulatory Visit: Payer: Self-pay | Admitting: Family Medicine

## 2024-10-23 DIAGNOSIS — E039 Hypothyroidism, unspecified: Secondary | ICD-10-CM

## 2024-10-23 NOTE — Telephone Encounter (Signed)
 Copied from CRM [id number]. Topic: Clinical - Medication Refill >> Oct 23, 2024  2:58 PM Santiya F wrote: Medication: levothyroxine  (SYNTHROID ) 200 MCG tablet [[id number]]  Has the patient contacted their pharmacy? Yes  (Agent: If yes, when and what did the pharmacy advise?) Contact office, pharmacy hasn't heard back   This is the patient's preferred pharmacy:  [id number] [place], [place] - [address] AT [facility] [address] [place] [address] [place] [address] Phone: [phone] Fax: [phone]  Is this the correct pharmacy for this prescription? Yes If no, delete pharmacy and type the correct one.   Has the prescription been filled recently? Yes  Is the patient out of the medication? No, has about 3 left   Has the patient been seen for an appointment in the last year OR does the patient have an upcoming appointment? Yes  Can we respond through MyChart? No  Agent: Please be advised that Rx refills may take up to 3 business days. We ask that you follow-up with your pharmacy.

## 2024-10-23 NOTE — Telephone Encounter (Signed)
 Too soon for refill.  Requested Prescriptions  Pending Prescriptions Disp Refills   levothyroxine  (SYNTHROID ) 200 MCG tablet [Pharmacy Med Name: LEVOTHYROXINE  0.2MG  ( ) TAB] 90 tablet 3    Sig: TAKE 1 TABLET(200 MCG) BY MOUTH DAILY BEFORE BREAKFAST     Endocrinology:  Hypothyroid Agents Failed - 10/23/2024  2:40 PM      Failed - TSH in normal range and within 360 days    TSH  Date Value Ref Range Status  02/19/2024 0.26 (L) 0.40 - 4.50 mIU/L Final         Passed - Valid encounter within last 12 months    Recent Outpatient Visits           1 month ago Centrilobular emphysema [facility])   [place] [facility] [place] PARAS, [place]   8 months ago Centrilobular emphysema [facility])   [place] [facility] [place] PARAS, [place]   8 months ago COPD with acute exacerbation [facility])    [facility] [place], [place] PARAS, [place]

## 2025-02-24 ENCOUNTER — Other Ambulatory Visit

## 2025-03-03 ENCOUNTER — Encounter: Admitting: Family Medicine
